# Patient Record
Sex: Female | Born: 1955 | Race: Black or African American | Hispanic: No | Marital: Married | Smoking: Never smoker
Health system: Southern US, Community
[De-identification: ages and names within clinical notes are randomized; demographics above are authoritative.]

## PROBLEM LIST (undated history)

## (undated) DIAGNOSIS — I10 Essential (primary) hypertension: Secondary | ICD-10-CM

## (undated) DIAGNOSIS — M1990 Unspecified osteoarthritis, unspecified site: Secondary | ICD-10-CM

## (undated) DIAGNOSIS — E119 Type 2 diabetes mellitus without complications: Secondary | ICD-10-CM

## (undated) DIAGNOSIS — E785 Hyperlipidemia, unspecified: Secondary | ICD-10-CM

## (undated) DIAGNOSIS — J302 Other seasonal allergic rhinitis: Secondary | ICD-10-CM

## (undated) DIAGNOSIS — E538 Deficiency of other specified B group vitamins: Secondary | ICD-10-CM

## (undated) DIAGNOSIS — H269 Unspecified cataract: Secondary | ICD-10-CM

## (undated) HISTORY — PX: EYE SURGERY: SHX253

## (undated) HISTORY — PX: ABDOMINAL HYSTERECTOMY: SHX81

## (undated) HISTORY — PX: BACK SURGERY: SHX140

---

## 2007-10-22 HISTORY — PX: JOINT REPLACEMENT: SHX530

## 2019-06-24 ENCOUNTER — Encounter (HOSPITAL_COMMUNITY): Payer: Self-pay

## 2019-06-24 NOTE — Patient Instructions (Addendum)
DUE TO COVID-19 ONLY ONE VISITOR IS ALLOWED TO COME WITH YOU AND STAY IN THE WAITING ROOM ONLY DURING PRE OP AND PROCEDURE. THE ONE VISITOR MAY VISIT WITH YOU IN YOUR PRIVATE ROOM DURING VISITING HOURS ONLY!!   COVID SWAB TESTING MUST BE COMPLETED ON: Today immediately after pre op appointment.    [address], [place] -[facility] enter pre surgical testing line (Must self quarantine after testing. Follow instructions on handout.)             Your procedure is scheduled on: Thursday, Sept. 10, 2020   Report to [facility] Main  Entrance   Report to Short Stay at 5:30 AM   Call this number if you have problems the morning of surgery [phone]   ight.     Brush your teeth the morning of surgery.   Do NOT smoke after Midnight   Take these medicines the morning of surgery with A SIP OF WATER Fexofenadine  DO NOT TAKE ANY DIABETIC MEDICATIONS DAY OF YOUR SURGERY                               You may not have any metal on your body including hair pins, jewelry, and body piercings             Do not wear make-up, lotions, powders, perfumes/cologne, or deodorant             Do not wear nail polish.  Do not shave  48 hours prior to surgery.             Do not bring valuables to the hospital. [place].   Contacts, dentures or bridgework may not be worn into surgery.   Bring small overnight bag day of surgery.   Special Instructions:  Clear liquids from midnight until 430 am, drink G 2 drink at 430 am.    CLEAR LIQUID DIET   Foods Allowed                                                                     Foods Excluded  Coffee and tea, regular and decaf                             liquids that you cannot  Plain Jell-O any favor except red or purple                                           see through such as: Fruit ices (not with fruit pulp)                                     milk, soups,  orange juice  Iced Popsicles  All solid food Carbonated beverages, regular and diet                                    Cranberry, grape and apple juices Sports drinks like Gatorade Lightly seasoned clear broth or consume(fat free) Sugar, honey syrup  Sample Menu Breakfast                                Lunch                                     Supper Cranberry juice                    Beef broth                            Chicken broth Jell-O                                     Grape juice                           Apple juice Coffee or tea                        Jell-O                                      Popsicle                                                Coffee or tea                        Coffee or tea  _____________________________________________________________________               Please read over the following fact sheets you were given:  [place] - Preparing for Surgery Before surgery, you can play an important role.  Because skin is not sterile, your skin needs to be as free of germs as possible.  You can reduce the number of germs on your skin by washing with CHG (chlorahexidine gluconate) soap before surgery.  CHG is an antiseptic cleaner which kills germs and bonds with the skin to continue killing germs even after washing. Please DO NOT use if you have an allergy to CHG or antibacterial soaps.  If your skin becomes reddened/irritated stop using the CHG and inform your nurse when you arrive at Short Stay. Do not shave (including legs and underarms) for at least 48 hours prior to the first CHG shower.  You may shave your face/neck.  Please follow these instructions carefully:  1.  Shower with CHG Soap the night before surgery and the  morning of surgery.  2.  If you choose to wash your hair, wash your hair first as usual with your normal  shampoo.  3.  After  you shampoo, rinse your hair and body thoroughly to remove the shampoo.                              4.  Use CHG as you would any other liquid soap.  You can apply chg directly to the skin and wash.  Gently with a scrungie or clean washcloth.  5.  Apply the CHG Soap to your body ONLY FROM THE NECK DOWN.   Do   not use on face/ open                           Wound or open sores. Avoid contact with eyes, ears mouth and   genitals (private parts).                       Wash face,  Genitals (private parts) with your normal soap.             6.  Wash thoroughly, paying special attention to the area where your    surgery  will be performed.  7.  Thoroughly rinse your body with warm water from the neck down.  8.  DO NOT shower/wash with your normal soap after using and rinsing off the CHG Soap.                9.  Pat yourself dry with a clean towel.            10.  Wear clean pajamas.            11.  Place clean sheets on your bed the night of your first shower and do not  sleep with pets. Day of Surgery : Do not apply any lotions/deodorants the morning of surgery.  Please wear clean clothes to the hospital/surgery center.  FAILURE TO FOLLOW THESE INSTRUCTIONS MAY RESULT IN THE CANCELLATION OF YOUR SURGERY  PATIENT SIGNATURE_________________________________  NURSE SIGNATURE__________________________________  ________________________________________________________________________

## 2019-06-29 ENCOUNTER — Ambulatory Visit: Payer: Self-pay | Admitting: Orthopedic Surgery

## 2019-06-29 ENCOUNTER — Other Ambulatory Visit (HOSPITAL_COMMUNITY)
Admission: RE | Admit: 2019-06-29 | Discharge: 2019-06-29 | Disposition: A | Discharge status: Home / Self Care | Payer: BC Managed Care – PPO | Source: Ambulatory Visit | Attending: Orthopedic Surgery | Admitting: Orthopedic Surgery

## 2019-06-29 ENCOUNTER — Encounter (HOSPITAL_COMMUNITY)
Admission: RE | Admit: 2019-06-29 | Discharge: 2019-06-29 | Disposition: A | Discharge status: Home / Self Care | Payer: BC Managed Care – PPO | Source: Ambulatory Visit | Attending: Orthopedic Surgery | Admitting: Orthopedic Surgery

## 2019-06-29 ENCOUNTER — Other Ambulatory Visit: Payer: Self-pay

## 2019-06-29 ENCOUNTER — Other Ambulatory Visit (HOSPITAL_COMMUNITY): Payer: Self-pay | Admitting: *Deleted

## 2019-06-29 ENCOUNTER — Encounter (HOSPITAL_COMMUNITY): Payer: Self-pay

## 2019-06-29 DIAGNOSIS — Z20828 Contact with and (suspected) exposure to other viral communicable diseases: Secondary | ICD-10-CM | POA: Diagnosis not present

## 2019-06-29 DIAGNOSIS — Z01812 Encounter for preprocedural laboratory examination: Secondary | ICD-10-CM | POA: Diagnosis not present

## 2019-06-29 DIAGNOSIS — M1612 Unilateral primary osteoarthritis, left hip: Secondary | ICD-10-CM | POA: Insufficient documentation

## 2019-06-29 HISTORY — DX: Hyperlipidemia, unspecified: E78.5

## 2019-06-29 HISTORY — DX: Other seasonal allergic rhinitis: J30.2

## 2019-06-29 HISTORY — DX: Essential (primary) hypertension: I10

## 2019-06-29 HISTORY — DX: Unspecified cataract: H26.9

## 2019-06-29 HISTORY — DX: Deficiency of other specified B group vitamins: E53.8

## 2019-06-29 HISTORY — DX: Unspecified osteoarthritis, unspecified site: M19.90

## 2019-06-29 HISTORY — DX: Type 2 diabetes mellitus without complications: E11.9

## 2019-06-29 LAB — CBC
HCT: 44 % (ref 36.0–46.0)
Hemoglobin: 14 g/dL (ref 12.0–15.0)
MCH: 28.9 pg (ref 26.0–34.0)
MCHC: 31.8 g/dL (ref 30.0–36.0)
MCV: 90.9 fL (ref 80.0–100.0)
Platelets: 368 10*3/uL (ref 150–400)
RBC: 4.84 MIL/uL (ref 3.87–5.11)
RDW: 14.6 % (ref 11.5–15.5)
WBC: 5.4 10*3/uL (ref 4.0–10.5)
nRBC: 0 % (ref 0.0–0.2)

## 2019-06-29 LAB — GLUCOSE, CAPILLARY: Glucose-Capillary: 130 mg/dL — ABNORMAL HIGH (ref 70–99)

## 2019-06-29 LAB — PROTIME-INR
INR: 1 (ref 0.8–1.2)
Prothrombin Time: 13.4 seconds (ref 11.4–15.2)

## 2019-06-29 LAB — COMPREHENSIVE METABOLIC PANEL
ALT: 21 U/L (ref 0–44)
AST: 20 U/L (ref 15–41)
Albumin: 4.5 g/dL (ref 3.5–5.0)
Alkaline Phosphatase: 54 U/L (ref 38–126)
Anion gap: 10 (ref 5–15)
BUN: 18 mg/dL (ref 8–23)
CO2: 31 mmol/L (ref 22–32)
Calcium: 10.1 mg/dL (ref 8.9–10.3)
Chloride: 101 mmol/L (ref 98–111)
Creatinine, Ser: 0.82 mg/dL (ref 0.44–1.00)
GFR calc Af Amer: >60 mL/min (ref >60)
GFR calc non Af Amer: >60 mL/min (ref >60)
Glucose, Bld: 105 mg/dL — ABNORMAL HIGH (ref 70–99)
Potassium: 4.3 mmol/L (ref 3.5–5.1)
Sodium: 142 mmol/L (ref 135–145)
Total Bilirubin: 0.4 mg/dL (ref 0.3–1.2)
Total Protein: 7.8 g/dL (ref 6.5–8.1)

## 2019-06-29 LAB — URINALYSIS, ROUTINE W REFLEX MICROSCOPIC
Bilirubin Urine: NEGATIVE
Glucose, UA: NEGATIVE mg/dL
Hgb urine dipstick: NEGATIVE
Ketones, ur: NEGATIVE mg/dL
Leukocytes,Ua: NEGATIVE
Nitrite: NEGATIVE
Protein, ur: NEGATIVE mg/dL
Specific Gravity, Urine: 1.018 (ref 1.005–1.030)
pH: 7 (ref 5.0–8.0)

## 2019-06-29 LAB — SURGICAL PCR SCREEN
MRSA, PCR: NEGATIVE
Staphylococcus aureus: NEGATIVE

## 2019-06-29 LAB — ABO/RH: ABO/RH(D): A POS

## 2019-06-29 LAB — SARS CORONAVIRUS 2 (TAT 6-24 HRS): SARS Coronavirus 2: NEGATIVE

## 2019-06-29 NOTE — Progress Notes (Signed)
PCP - dr Audery Amel vasireddy,  [place] patient care lov note 06-15-2019 and medical clearcne note 06-15-2019 on chart phone [phone] Cardiologist - none  Chest x-ray -  EKG - 06-02-2019 dr  Audery Amel vasireddy on chart Stress Test - none ECHO - none Cardiac Cath - none  Sleep Study - none CPAP - none  Fasting Blood Sugar - 110 Checks Blood Sugar  1 x day  hemaglobin a1c 06-02-2019 dr vasireddy on chart ((6.7) Blood Thinner Instructions:none Aspirin Instructions:none Last Dose:  Anesthesia review:   Patient denies shortness of breath, fever, cough and chest pain at PAT appointment   Patient verbalized understanding of instructions that were given to them at the PAT appointment. Patient was also instructed that they will need to review over the PAT instructions again at home before surgery.

## 2019-06-29 NOTE — H&P (View-Only) (Signed)
TOTAL HIP ADMISSION H&P  Patient is admitted for left total hip arthroplasty.  Subjective:  Chief Complaint: left hip pain  HPI: Carly Raymond, 63 y.o. female, has a history of pain and functional disability in the left hip(s) due to arthritis and patient has failed non-surgical conservative treatments for greater than 12 weeks to include NSAID's and/or analgesics, flexibility and strengthening excercises, use of assistive devices, weight reduction as appropriate and activity modification.  Onset of symptoms was gradual starting 4 years ago with rapidlly worsening course since that time.The patient noted no past surgery on the left hip(s).  Patient currently rates pain in the left hip at 10 out of 10 with activity. Patient has night pain, worsening of pain with activity and weight bearing, trendelenberg gait, pain that interfers with activities of daily living, pain with passive range of motion and crepitus. Patient has evidence of subchondral cysts, subchondral sclerosis, periarticular osteophytes and joint space narrowing by imaging studies. This condition presents safety issues increasing the risk of falls. There is no current active infection.  There are no active problems to display for this patient.  Past Medical History:  Diagnosis Date  . Arthritis   . Cataract    left  . Diabetes mellitus without complication ([place])    type 2  . Hyperlipidemia   . Hypertension   . Seasonal allergies   . Vitamin B 12 deficiency     Past Surgical History:  Procedure Laterality Date  . ABDOMINAL HYSTERECTOMY     partail  . BACK SURGERY     lower  . EYE SURGERY Right    cataract  . JOINT REPLACEMENT  2009   right hip replacement    No current facility-administered medications for this visit.    No current outpatient medications on file.   Facility-Administered Medications Ordered in Other Visits  Medication Dose Route Frequency Provider Last Rate Last Dose  . 0.9 %  sodium chloride  infusion   Intravenous Continuous Johntay Doolen, Aaron Edelman, MD      . acetaminophen (OFIRMEV) IV 1,000 mg  1,000 mg Intravenous To OR Palestine Mosco, Aaron Edelman, MD      . bupivacaine-EPINEPHrine (MARCAINE W/ EPI) 0.5% -1:200000 (with pres) injection    PRN Rod Can, MD   30 mL at 07/01/19 0723  . ceFAZolin (ANCEF) IVPB 2g/100 mL premix  2 g Intravenous On Call to Alvarado, Aaron Edelman, MD      . chlorhexidine (HIBICLENS) 4 % liquid 4 application  60 mL Topical Once Saban Heinlen, Aaron Edelman, MD      . chlorhexidine (HIBICLENS) 4 % liquid 4 application  60 mL Topical Once Rod Can, MD      . isopropyl alcohol 70 % external solution    PRN Rod Can, MD   1 application at 40/98/11 [phone]  . ketorolac (TORADOL) 30 MG/ML injection    PRN Rod Can, MD   30 mg at 07/01/19 0724  . lactated ringers infusion   Intravenous Continuous Audry Pili, MD 10 mL/hr at 07/01/19 [phone]    . sodium chloride (PF) 0.9 % injection    PRN Rod Can, MD   30 mL at 07/01/19 0724  . sodium chloride irrigation 0.9 %    PRN Rod Can, MD   1,000 mL at 07/01/19 0722  . sodium chloride irrigation 0.9 %    PRN Rod Can, MD   1,000 mL at 07/01/19 0722  . tranexamic acid (CYKLOKAPRON) IVPB 1,000 mg  1,000 mg Intravenous To OR Rod Can, MD      .  water for irrigation, sterile for irrigation SOLN    PRN Phillipe Clemon, MD   1,000 mL at 07/01/19 0724   No Known Allergies  Social History   Tobacco Use  . Smoking status: Never Smoker  . Smokeless tobacco: Never Used  Substance Use Topics  . Alcohol use: Never    Frequency: Never    No family history on file.   Review of Systems  Constitutional: Negative.   HENT: Negative.   Eyes: Negative.   Respiratory: Negative.   Cardiovascular: Negative.   Gastrointestinal: Negative.   Genitourinary: Negative.   Musculoskeletal: Positive for joint pain.  Skin: Negative.   Neurological: Negative.   Endo/Heme/Allergies: Negative.   Psychiatric/Behavioral:  Negative.     Objective:  Physical Exam  Vitals reviewed. Constitutional: She is oriented to person, place, and time. She appears well-developed and well-nourished.  HENT:  Head: Normocephalic and atraumatic.  Eyes: Pupils are equal, round, and reactive to light. EOM are normal.  Neck: Normal range of motion. No thyromegaly present.  Cardiovascular: Normal rate, regular rhythm and intact distal pulses.  Respiratory: Effort normal. No respiratory distress.  GI: Soft. She exhibits no distension.  Genitourinary:    Genitourinary Comments: deferred   Musculoskeletal:     Left hip: She exhibits decreased range of motion, decreased strength and bony tenderness.  Neurological: She is alert and oriented to person, place, and time. She has normal reflexes.  Skin: Skin is warm and dry.  Psychiatric: She has a normal mood and affect. Her behavior is normal. Judgment and thought content normal.    Vital signs in last 24 hours: @VSRANGES@  Labs:   Estimated body mass index is 29.67 kg/m as calculated from the following:   Height as of 07/01/19: 5' 1" (1.549 m).   Weight as of 07/01/19: 71.2 kg.   Imaging Review Plain radiographs demonstrate severe degenerative joint disease of the left hip(s). The bone quality appears to be adequate for age and reported activity level.      Assessment/Plan:  End stage arthritis, left hip(s)  The patient history, physical examination, clinical judgement of the provider and imaging studies are consistent with end stage degenerative joint disease of the left hip(s) and total hip arthroplasty is deemed medically necessary. The treatment options including medical management, injection therapy, arthroscopy and arthroplasty were discussed at length. The risks and benefits of total hip arthroplasty were presented and reviewed. The risks due to aseptic loosening, infection, stiffness, dislocation/subluxation,  thromboembolic complications and other  imponderables were discussed.  The patient acknowledged the explanation, agreed to proceed with the plan and consent was signed. Patient is being admitted for inpatient treatment for surgery, pain control, PT, OT, prophylactic antibiotics, VTE prophylaxis, progressive ambulation and ADL's and discharge planning.The patient is planning to be discharged home with HEP    Patient's anticipated LOS is less than 2 midnights, meeting these requirements: - Younger than 65 - Lives within 1 hour of care - Has a competent adult at home to recover with post-op recover - NO history of  - Chronic pain requiring opiods  - Diabetes  - Coronary Artery Disease  - Heart failure  - Heart attack  - Stroke  - DVT/VTE  - Cardiac arrhythmia  - Respiratory Failure/COPD  - Renal failure  - Anemia  - Advanced Liver disease     

## 2019-06-29 NOTE — H&P (Signed)
TOTAL HIP ADMISSION H&P  Patient is admitted for left total hip arthroplasty.  Subjective:  Chief Complaint: left hip pain  HPI: Carly Raymond, 63 y.o. female, has a history of pain and functional disability in the left hip(s) due to arthritis and patient has failed non-surgical conservative treatments for greater than 12 weeks to include NSAID's and/or analgesics, flexibility and strengthening excercises, use of assistive devices, weight reduction as appropriate and activity modification.  Onset of symptoms was gradual starting 4 years ago with rapidlly worsening course since that time.The patient noted no past surgery on the left hip(s).  Patient currently rates pain in the left hip at 10 out of 10 with activity. Patient has night pain, worsening of pain with activity and weight bearing, trendelenberg gait, pain that interfers with activities of daily living, pain with passive range of motion and crepitus. Patient has evidence of subchondral cysts, subchondral sclerosis, periarticular osteophytes and joint space narrowing by imaging studies. This condition presents safety issues increasing the risk of falls. There is no current active infection.  There are no active problems to display for this patient.  Past Medical History:  Diagnosis Date  . Arthritis   . Cataract    left  . Diabetes mellitus without complication ([place])    type 2  . Hyperlipidemia   . Hypertension   . Seasonal allergies   . Vitamin B 12 deficiency     Past Surgical History:  Procedure Laterality Date  . ABDOMINAL HYSTERECTOMY     partail  . BACK SURGERY     lower  . EYE SURGERY Right    cataract  . JOINT REPLACEMENT  2009   right hip replacement    No current facility-administered medications for this visit.    No current outpatient medications on file.   Facility-Administered Medications Ordered in Other Visits  Medication Dose Route Frequency Provider Last Rate Last Dose  . 0.9 %  sodium chloride  infusion   Intravenous Continuous Gabriele Zwilling, Aaron Edelman, MD      . acetaminophen (OFIRMEV) IV 1,000 mg  1,000 mg Intravenous To OR Terriyah Westra, Aaron Edelman, MD      . bupivacaine-EPINEPHrine (MARCAINE W/ EPI) 0.5% -1:200000 (with pres) injection    PRN Rod Can, MD   30 mL at 07/01/19 0723  . ceFAZolin (ANCEF) IVPB 2g/100 mL premix  2 g Intravenous On Call to Alvarado, Aaron Edelman, MD      . chlorhexidine (HIBICLENS) 4 % liquid 4 application  60 mL Topical Once Oberia Beaudoin, Aaron Edelman, MD      . chlorhexidine (HIBICLENS) 4 % liquid 4 application  60 mL Topical Once Rod Can, MD      . isopropyl alcohol 70 % external solution    PRN Rod Can, MD   1 application at 40/98/11 [phone]  . ketorolac (TORADOL) 30 MG/ML injection    PRN Rod Can, MD   30 mg at 07/01/19 0724  . lactated ringers infusion   Intravenous Continuous Audry Pili, MD 10 mL/hr at 07/01/19 [phone]    . sodium chloride (PF) 0.9 % injection    PRN Rod Can, MD   30 mL at 07/01/19 0724  . sodium chloride irrigation 0.9 %    PRN Rod Can, MD   1,000 mL at 07/01/19 0722  . sodium chloride irrigation 0.9 %    PRN Rod Can, MD   1,000 mL at 07/01/19 0722  . tranexamic acid (CYKLOKAPRON) IVPB 1,000 mg  1,000 mg Intravenous To OR Rod Can, MD      .  water for irrigation, sterile for irrigation SOLN    PRN Samson FredericSwinteck, Emilya Justen, MD   1,000 mL at 07/01/19 0724   No Known Allergies  Social History   Tobacco Use  . Smoking status: Never Smoker  . Smokeless tobacco: Never Used  Substance Use Topics  . Alcohol use: Never    Frequency: Never    No family history on file.   Review of Systems  Constitutional: Negative.   HENT: Negative.   Eyes: Negative.   Respiratory: Negative.   Cardiovascular: Negative.   Gastrointestinal: Negative.   Genitourinary: Negative.   Musculoskeletal: Positive for joint pain.  Skin: Negative.   Neurological: Negative.   Endo/Heme/Allergies: Negative.   Psychiatric/Behavioral:  Negative.     Objective:  Physical Exam  Vitals reviewed. Constitutional: She is oriented to person, place, and time. She appears well-developed and well-nourished.  HENT:  Head: Normocephalic and atraumatic.  Eyes: Pupils are equal, round, and reactive to light. EOM are normal.  Neck: Normal range of motion. No thyromegaly present.  Cardiovascular: Normal rate, regular rhythm and intact distal pulses.  Respiratory: Effort normal. No respiratory distress.  GI: Soft. She exhibits no distension.  Genitourinary:    Genitourinary Comments: deferred   Musculoskeletal:     Left hip: She exhibits decreased range of motion, decreased strength and bony tenderness.  Neurological: She is alert and oriented to person, place, and time. She has normal reflexes.  Skin: Skin is warm and dry.  Psychiatric: She has a normal mood and affect. Her behavior is normal. Judgment and thought content normal.    Vital signs in last 24 hours: @VSRANGES @  Labs:   Estimated body mass index is 29.67 kg/m as calculated from the following:   Height as of 07/01/19: 5\' 1"  (1.549 m).   Weight as of 07/01/19: 71.2 kg.   Imaging Review Plain radiographs demonstrate severe degenerative joint disease of the left hip(s). The bone quality appears to be adequate for age and reported activity level.      Assessment/Plan:  End stage arthritis, left hip(s)  The patient history, physical examination, clinical judgement of the provider and imaging studies are consistent with end stage degenerative joint disease of the left hip(s) and total hip arthroplasty is deemed medically necessary. The treatment options including medical management, injection therapy, arthroscopy and arthroplasty were discussed at length. The risks and benefits of total hip arthroplasty were presented and reviewed. The risks due to aseptic loosening, infection, stiffness, dislocation/subluxation,  thromboembolic complications and other  imponderables were discussed.  The patient acknowledged the explanation, agreed to proceed with the plan and consent was signed. Patient is being admitted for inpatient treatment for surgery, pain control, PT, OT, prophylactic antibiotics, VTE prophylaxis, progressive ambulation and ADL's and discharge planning.The patient is planning to be discharged home with HEP    Patient's anticipated LOS is less than 2 midnights, meeting these requirements: - Younger than 9565 - Lives within 1 hour of care - Has a competent adult at home to recover with post-op recover - NO history of  - Chronic pain requiring opiods  - Diabetes  - Coronary Artery Disease  - Heart failure  - Heart attack  - Stroke  - DVT/VTE  - Cardiac arrhythmia  - Respiratory Failure/COPD  - Renal failure  - Anemia  - Advanced Liver disease

## 2019-07-01 ENCOUNTER — Inpatient Hospital Stay (HOSPITAL_COMMUNITY): Payer: BC Managed Care – PPO | Admitting: Physician Assistant

## 2019-07-01 ENCOUNTER — Other Ambulatory Visit: Payer: Self-pay

## 2019-07-01 ENCOUNTER — Inpatient Hospital Stay (HOSPITAL_COMMUNITY): Payer: BC Managed Care – PPO

## 2019-07-01 ENCOUNTER — Encounter (HOSPITAL_COMMUNITY)
Admission: RE | Disposition: A | Discharge status: Home / Self Care | Payer: Self-pay | Source: Home / Self Care | Attending: Orthopedic Surgery

## 2019-07-01 ENCOUNTER — Inpatient Hospital Stay (HOSPITAL_COMMUNITY): Payer: BC Managed Care – PPO | Admitting: Registered Nurse

## 2019-07-01 ENCOUNTER — Encounter (HOSPITAL_COMMUNITY): Payer: Self-pay

## 2019-07-01 ENCOUNTER — Inpatient Hospital Stay (HOSPITAL_COMMUNITY)
Admission: RE | Admit: 2019-07-01 | Discharge: 2019-07-02 | DRG: 470 | Disposition: A | Discharge status: Home / Self Care | Payer: BC Managed Care – PPO | Attending: Orthopedic Surgery | Admitting: Orthopedic Surgery

## 2019-07-01 DIAGNOSIS — E785 Hyperlipidemia, unspecified: Secondary | ICD-10-CM | POA: Diagnosis present

## 2019-07-01 DIAGNOSIS — E119 Type 2 diabetes mellitus without complications: Secondary | ICD-10-CM | POA: Diagnosis present

## 2019-07-01 DIAGNOSIS — J302 Other seasonal allergic rhinitis: Secondary | ICD-10-CM | POA: Diagnosis present

## 2019-07-01 DIAGNOSIS — I10 Essential (primary) hypertension: Secondary | ICD-10-CM | POA: Diagnosis present

## 2019-07-01 DIAGNOSIS — M1612 Unilateral primary osteoarthritis, left hip: Secondary | ICD-10-CM | POA: Diagnosis present

## 2019-07-01 DIAGNOSIS — Z09 Encounter for follow-up examination after completed treatment for conditions other than malignant neoplasm: Secondary | ICD-10-CM

## 2019-07-01 DIAGNOSIS — E538 Deficiency of other specified B group vitamins: Secondary | ICD-10-CM | POA: Diagnosis present

## 2019-07-01 DIAGNOSIS — Z96641 Presence of right artificial hip joint: Secondary | ICD-10-CM | POA: Diagnosis present

## 2019-07-01 DIAGNOSIS — Z90710 Acquired absence of both cervix and uterus: Secondary | ICD-10-CM

## 2019-07-01 DIAGNOSIS — Z419 Encounter for procedure for purposes other than remedying health state, unspecified: Secondary | ICD-10-CM

## 2019-07-01 HISTORY — PX: TOTAL HIP ARTHROPLASTY: SHX124

## 2019-07-01 LAB — GLUCOSE, CAPILLARY
Glucose-Capillary: 112 mg/dL — ABNORMAL HIGH (ref 70–99)
Glucose-Capillary: 113 mg/dL — ABNORMAL HIGH (ref 70–99)
Glucose-Capillary: 156 mg/dL — ABNORMAL HIGH (ref 70–99)

## 2019-07-01 LAB — TYPE AND SCREEN
ABO/RH(D): A POS
Antibody Screen: NEGATIVE

## 2019-07-01 SURGERY — ARTHROPLASTY, HIP, TOTAL, ANTERIOR APPROACH
Anesthesia: Monitor Anesthesia Care | Site: Hip | Laterality: Left

## 2019-07-01 MED ORDER — METHOCARBAMOL 500 MG PO TABS: 500.0000 mg | ORAL_TABLET | Freq: Four times a day (QID) | ORAL | Status: DC | PRN

## 2019-07-01 MED ORDER — AMLODIPINE BESYLATE 10 MG PO TABS
10.0000 mg | ORAL_TABLET | Freq: Every day | ORAL | Status: DC
  Administered 2019-07-01: 10 mg via ORAL
  Filled 2019-07-01: qty 1

## 2019-07-01 MED ORDER — DEXAMETHASONE SODIUM PHOSPHATE 10 MG/ML IJ SOLN
INTRAMUSCULAR | Status: AC
  Filled 2019-07-01: qty 1

## 2019-07-01 MED ORDER — ACETAMINOPHEN 10 MG/ML IV SOLN: 1000.0000 mg | Freq: Once | INTRAVENOUS | Status: DC | PRN

## 2019-07-01 MED ORDER — PHENOL 1.4 % MT LIQD
1.0000 | OROMUCOSAL | Status: DC | PRN
  Filled 2019-07-01: qty 177

## 2019-07-01 MED ORDER — BUPIVACAINE IN DEXTROSE 0.75-8.25 % IT SOLN
INTRATHECAL | Status: DC | PRN
  Administered 2019-07-01: 1.6 mL via INTRATHECAL

## 2019-07-01 MED ORDER — PROPOFOL 500 MG/50ML IV EMUL
INTRAVENOUS | Status: DC | PRN
  Administered 2019-07-01: 50 ug/kg/min via INTRAVENOUS

## 2019-07-01 MED ORDER — CHLORHEXIDINE GLUCONATE 4 % EX LIQD: 60.0000 mL | Freq: Once | CUTANEOUS | Status: DC

## 2019-07-01 MED ORDER — ACETAMINOPHEN 160 MG/5ML PO SOLN: 1000.0000 mg | Freq: Once | ORAL | Status: DC | PRN

## 2019-07-01 MED ORDER — CEFAZOLIN SODIUM-DEXTROSE 2-4 GM/100ML-% IV SOLN
2.0000 g | INTRAVENOUS | Status: AC
  Administered 2019-07-01: 2 g via INTRAVENOUS
  Filled 2019-07-01: qty 100

## 2019-07-01 MED ORDER — SODIUM CHLORIDE (PF) 0.9 % IJ SOLN
INTRAMUSCULAR | Status: DC | PRN
  Administered 2019-07-01: 30 mL

## 2019-07-01 MED ORDER — OXYCODONE HCL 5 MG/5ML PO SOLN: 5.0000 mg | Freq: Once | ORAL | Status: DC | PRN

## 2019-07-01 MED ORDER — SODIUM CHLORIDE 0.9 % IR SOLN
Status: DC | PRN
  Administered 2019-07-01: 1000 mL

## 2019-07-01 MED ORDER — SODIUM CHLORIDE 0.9 % IV SOLN
INTRAVENOUS | Status: DC
  Administered 2019-07-01 – 2019-07-02 (×2): via INTRAVENOUS

## 2019-07-01 MED ORDER — FENTANYL CITRATE (PF) 100 MCG/2ML IJ SOLN
25.0000 ug | INTRAMUSCULAR | Status: DC | PRN
  Administered 2019-07-01 (×2): 50 ug via INTRAVENOUS

## 2019-07-01 MED ORDER — PROPOFOL 10 MG/ML IV BOLUS
INTRAVENOUS | Status: AC
  Filled 2019-07-01: qty 80

## 2019-07-01 MED ORDER — MIDAZOLAM HCL 5 MG/5ML IJ SOLN
INTRAMUSCULAR | Status: DC | PRN
  Administered 2019-07-01 (×2): 1 mg via INTRAVENOUS

## 2019-07-01 MED ORDER — WATER FOR IRRIGATION, STERILE IR SOLN
Status: DC | PRN
  Administered 2019-07-01 (×2): 1000 mL

## 2019-07-01 MED ORDER — METOCLOPRAMIDE HCL 5 MG/ML IJ SOLN: 5.0000 mg | Freq: Three times a day (TID) | INTRAMUSCULAR | Status: DC | PRN

## 2019-07-01 MED ORDER — ISOPROPYL ALCOHOL 70 % SOLN
Status: AC
  Filled 2019-07-01: qty 480

## 2019-07-01 MED ORDER — MENTHOL 3 MG MT LOZG: 1.0000 | LOZENGE | OROMUCOSAL | Status: DC | PRN

## 2019-07-01 MED ORDER — FENTANYL CITRATE (PF) 100 MCG/2ML IJ SOLN
INTRAMUSCULAR | Status: DC | PRN
  Administered 2019-07-01 (×2): 50 ug via INTRAVENOUS

## 2019-07-01 MED ORDER — FENTANYL CITRATE (PF) 100 MCG/2ML IJ SOLN
INTRAMUSCULAR | Status: AC
  Filled 2019-07-01: qty 2

## 2019-07-01 MED ORDER — POVIDONE-IODINE 10 % EX SWAB
2.0000 "application " | Freq: Once | CUTANEOUS | Status: AC
  Administered 2019-07-01: 2 via TOPICAL

## 2019-07-01 MED ORDER — ISOPROPYL ALCOHOL 70 % SOLN
Status: DC | PRN
  Administered 2019-07-01: 1 via TOPICAL

## 2019-07-01 MED ORDER — POLYETHYLENE GLYCOL 3350 17 G PO PACK: 17.0000 g | PACK | Freq: Every day | ORAL | Status: DC | PRN

## 2019-07-01 MED ORDER — METHOCARBAMOL 500 MG IVPB - SIMPLE MED
INTRAVENOUS | Status: AC
  Filled 2019-07-01: qty 50

## 2019-07-01 MED ORDER — ACETAMINOPHEN 10 MG/ML IV SOLN
1000.0000 mg | INTRAVENOUS | Status: AC
  Administered 2019-07-01: 08:00:00 1000 mg via INTRAVENOUS
  Filled 2019-07-01: qty 100

## 2019-07-01 MED ORDER — METFORMIN HCL 500 MG PO TABS
1000.0000 mg | ORAL_TABLET | Freq: Two times a day (BID) | ORAL | Status: DC
  Administered 2019-07-01 – 2019-07-02 (×2): 1000 mg via ORAL
  Filled 2019-07-01 (×2): qty 2

## 2019-07-01 MED ORDER — CEFAZOLIN SODIUM-DEXTROSE 2-4 GM/100ML-% IV SOLN
2.0000 g | Freq: Four times a day (QID) | INTRAVENOUS | Status: AC
  Administered 2019-07-01 (×2): 2 g via INTRAVENOUS
  Filled 2019-07-01 (×2): qty 100

## 2019-07-01 MED ORDER — EPHEDRINE SULFATE-NACL 50-0.9 MG/10ML-% IV SOSY
PREFILLED_SYRINGE | INTRAVENOUS | Status: DC | PRN
  Administered 2019-07-01: 10 mg via INTRAVENOUS

## 2019-07-01 MED ORDER — LACTATED RINGERS IV SOLN
INTRAVENOUS | Status: DC
  Administered 2019-07-01 (×2): via INTRAVENOUS

## 2019-07-01 MED ORDER — ASPIRIN 81 MG PO CHEW
81.0000 mg | CHEWABLE_TABLET | Freq: Two times a day (BID) | ORAL | Status: DC
  Administered 2019-07-01 – 2019-07-02 (×2): 81 mg via ORAL
  Filled 2019-07-01 (×2): qty 1

## 2019-07-01 MED ORDER — SPIRONOLACTONE 25 MG PO TABS
50.0000 mg | ORAL_TABLET | Freq: Every day | ORAL | Status: DC
  Administered 2019-07-01 – 2019-07-02 (×2): 50 mg via ORAL
  Filled 2019-07-01 (×2): qty 2

## 2019-07-01 MED ORDER — POVIDONE-IODINE 10 % EX SWAB: 2.0000 "application " | Freq: Once | CUTANEOUS | Status: DC

## 2019-07-01 MED ORDER — KETOROLAC TROMETHAMINE 30 MG/ML IJ SOLN
INTRAMUSCULAR | Status: AC
  Filled 2019-07-01: qty 1

## 2019-07-01 MED ORDER — ONDANSETRON HCL 4 MG/2ML IJ SOLN: 4.0000 mg | Freq: Four times a day (QID) | INTRAMUSCULAR | Status: DC | PRN

## 2019-07-01 MED ORDER — HYDROCODONE-ACETAMINOPHEN 7.5-325 MG PO TABS: 1.0000 | ORAL_TABLET | ORAL | Status: DC | PRN

## 2019-07-01 MED ORDER — ONDANSETRON HCL 4 MG/2ML IJ SOLN
INTRAMUSCULAR | Status: AC
  Filled 2019-07-01: qty 2

## 2019-07-01 MED ORDER — BUPIVACAINE-EPINEPHRINE (PF) 0.5% -1:200000 IJ SOLN
INTRAMUSCULAR | Status: AC
  Filled 2019-07-01: qty 30

## 2019-07-01 MED ORDER — LIDOCAINE 2% (20 MG/ML) 5 ML SYRINGE
INTRAMUSCULAR | Status: AC
  Filled 2019-07-01: qty 5

## 2019-07-01 MED ORDER — ONDANSETRON HCL 4 MG PO TABS: 4.0000 mg | ORAL_TABLET | Freq: Four times a day (QID) | ORAL | Status: DC | PRN

## 2019-07-01 MED ORDER — SODIUM CHLORIDE 0.9 % IV SOLN
INTRAVENOUS | Status: DC
  Administered 2019-07-01: 12:00:00 via INTRAVENOUS

## 2019-07-01 MED ORDER — METHOCARBAMOL 500 MG IVPB - SIMPLE MED
500.0000 mg | Freq: Four times a day (QID) | INTRAVENOUS | Status: DC | PRN
  Administered 2019-07-01: 500 mg via INTRAVENOUS
  Filled 2019-07-01: qty 50

## 2019-07-01 MED ORDER — DOCUSATE SODIUM 100 MG PO CAPS
100.0000 mg | ORAL_CAPSULE | Freq: Two times a day (BID) | ORAL | Status: DC
  Administered 2019-07-01 – 2019-07-02 (×2): 100 mg via ORAL
  Filled 2019-07-01 (×2): qty 1

## 2019-07-01 MED ORDER — SODIUM CHLORIDE (PF) 0.9 % IJ SOLN
INTRAMUSCULAR | Status: AC
  Filled 2019-07-01: qty 50

## 2019-07-01 MED ORDER — LOSARTAN POTASSIUM 50 MG PO TABS
100.0000 mg | ORAL_TABLET | Freq: Every day | ORAL | Status: DC
  Administered 2019-07-01 – 2019-07-02 (×2): 100 mg via ORAL
  Filled 2019-07-01 (×2): qty 2

## 2019-07-01 MED ORDER — SENNA 8.6 MG PO TABS
1.0000 | ORAL_TABLET | Freq: Two times a day (BID) | ORAL | Status: DC
  Administered 2019-07-02 (×2): 8.6 mg via ORAL
  Filled 2019-07-01 (×2): qty 1

## 2019-07-01 MED ORDER — DEXAMETHASONE SODIUM PHOSPHATE 10 MG/ML IJ SOLN
INTRAMUSCULAR | Status: DC | PRN
  Administered 2019-07-01: 5 mg via INTRAVENOUS

## 2019-07-01 MED ORDER — ATORVASTATIN CALCIUM 40 MG PO TABS
80.0000 mg | ORAL_TABLET | Freq: Every day | ORAL | Status: DC
  Administered 2019-07-01: 80 mg via ORAL
  Filled 2019-07-01: qty 2

## 2019-07-01 MED ORDER — EPHEDRINE 5 MG/ML INJ
INTRAVENOUS | Status: AC
  Filled 2019-07-01: qty 10

## 2019-07-01 MED ORDER — TRANEXAMIC ACID-NACL 1000-0.7 MG/100ML-% IV SOLN
1000.0000 mg | INTRAVENOUS | Status: AC
  Administered 2019-07-01: 1000 mg via INTRAVENOUS
  Filled 2019-07-01: qty 100

## 2019-07-01 MED ORDER — INSULIN ASPART 100 UNIT/ML ~~LOC~~ SOLN
0.0000 [IU] | Freq: Three times a day (TID) | SUBCUTANEOUS | Status: DC
  Administered 2019-07-02: 09:00:00 1 [IU] via SUBCUTANEOUS

## 2019-07-01 MED ORDER — DIPHENHYDRAMINE HCL 12.5 MG/5ML PO ELIX: 12.5000 mg | ORAL_SOLUTION | ORAL | Status: DC | PRN

## 2019-07-01 MED ORDER — HYDROCODONE-ACETAMINOPHEN 5-325 MG PO TABS
1.0000 | ORAL_TABLET | ORAL | Status: DC | PRN
  Administered 2019-07-01 – 2019-07-02 (×4): 2 via ORAL
  Filled 2019-07-01 (×5): qty 2

## 2019-07-01 MED ORDER — BUPIVACAINE-EPINEPHRINE 0.5% -1:200000 IJ SOLN
INTRAMUSCULAR | Status: DC | PRN
  Administered 2019-07-01: 30 mL

## 2019-07-01 MED ORDER — ACETAMINOPHEN 500 MG PO TABS: 1000.0000 mg | ORAL_TABLET | Freq: Once | ORAL | Status: DC | PRN

## 2019-07-01 MED ORDER — ONDANSETRON HCL 4 MG/2ML IJ SOLN
INTRAMUSCULAR | Status: DC | PRN
  Administered 2019-07-01: 4 mg via INTRAVENOUS

## 2019-07-01 MED ORDER — KETOROLAC TROMETHAMINE 15 MG/ML IJ SOLN
15.0000 mg | Freq: Four times a day (QID) | INTRAMUSCULAR | Status: AC
  Administered 2019-07-01 – 2019-07-02 (×4): 15 mg via INTRAVENOUS
  Filled 2019-07-01 (×4): qty 1

## 2019-07-01 MED ORDER — ACETAMINOPHEN 325 MG PO TABS: 325.0000 mg | ORAL_TABLET | Freq: Four times a day (QID) | ORAL | Status: DC | PRN

## 2019-07-01 MED ORDER — DEXAMETHASONE SODIUM PHOSPHATE 10 MG/ML IJ SOLN
10.0000 mg | Freq: Once | INTRAMUSCULAR | Status: AC
  Administered 2019-07-02: 10 mg via INTRAVENOUS
  Filled 2019-07-01: qty 1

## 2019-07-01 MED ORDER — MORPHINE SULFATE (PF) 2 MG/ML IV SOLN: 0.5000 mg | INTRAVENOUS | Status: DC | PRN

## 2019-07-01 MED ORDER — LIDOCAINE 2% (20 MG/ML) 5 ML SYRINGE
INTRAMUSCULAR | Status: DC | PRN
  Administered 2019-07-01: 40 mg via INTRAVENOUS

## 2019-07-01 MED ORDER — ALUM & MAG HYDROXIDE-SIMETH 200-200-20 MG/5ML PO SUSP: 30.0000 mL | ORAL | Status: DC | PRN

## 2019-07-01 MED ORDER — LORATADINE 10 MG PO TABS
10.0000 mg | ORAL_TABLET | Freq: Every day | ORAL | Status: DC
  Administered 2019-07-01 – 2019-07-02 (×2): 10 mg via ORAL
  Filled 2019-07-01 (×2): qty 1

## 2019-07-01 MED ORDER — OXYCODONE HCL 5 MG PO TABS: 5.0000 mg | ORAL_TABLET | Freq: Once | ORAL | Status: DC | PRN

## 2019-07-01 MED ORDER — KETOROLAC TROMETHAMINE 30 MG/ML IJ SOLN
INTRAMUSCULAR | Status: DC | PRN
  Administered 2019-07-01: 30 mg

## 2019-07-01 MED ORDER — INSULIN ASPART 100 UNIT/ML ~~LOC~~ SOLN: 0.0000 [IU] | Freq: Every day | SUBCUTANEOUS | Status: DC

## 2019-07-01 MED ORDER — INFLUENZA VAC SPLIT QUAD 0.5 ML IM SUSY: 0.5000 mL | PREFILLED_SYRINGE | INTRAMUSCULAR | Status: DC

## 2019-07-01 MED ORDER — PHENYLEPHRINE 40 MCG/ML (10ML) SYRINGE FOR IV PUSH (FOR BLOOD PRESSURE SUPPORT)
PREFILLED_SYRINGE | INTRAVENOUS | Status: AC
  Filled 2019-07-01: qty 10

## 2019-07-01 MED ORDER — MIDAZOLAM HCL 2 MG/2ML IJ SOLN
INTRAMUSCULAR | Status: AC
  Filled 2019-07-01: qty 2

## 2019-07-01 MED ORDER — PHENYLEPHRINE 40 MCG/ML (10ML) SYRINGE FOR IV PUSH (FOR BLOOD PRESSURE SUPPORT)
PREFILLED_SYRINGE | INTRAVENOUS | Status: DC | PRN
  Administered 2019-07-01: 80 ug via INTRAVENOUS

## 2019-07-01 MED ORDER — METOCLOPRAMIDE HCL 5 MG PO TABS: 5.0000 mg | ORAL_TABLET | Freq: Three times a day (TID) | ORAL | Status: DC | PRN

## 2019-07-01 SURGICAL SUPPLY — 56 items
BAG DECANTER FOR FLEXI CONT (MISCELLANEOUS) IMPLANT
BAG ZIPLOCK 12X15 (MISCELLANEOUS) IMPLANT
BLADE SURG SZ10 CARB STEEL (BLADE) IMPLANT
CHLORAPREP W/TINT 26 (MISCELLANEOUS) ×3 IMPLANT
COVER PERINEAL POST (MISCELLANEOUS) ×3 IMPLANT
COVER SURGICAL LIGHT HANDLE (MISCELLANEOUS) ×3 IMPLANT
COVER WAND RF STERILE (DRAPES) IMPLANT
CUP ACET PINNACLE SECTR 48MM (Joint) ×1 IMPLANT
DECANTER SPIKE VIAL GLASS SM (MISCELLANEOUS) ×3 IMPLANT
DERMABOND ADVANCED (GAUZE/BANDAGES/DRESSINGS) ×4
DERMABOND ADVANCED .7 DNX12 (GAUZE/BANDAGES/DRESSINGS) ×2 IMPLANT
DRAPE IMP U-DRAPE 54X76 (DRAPES) ×3 IMPLANT
DRAPE SHEET LG 3/4 BI-LAMINATE (DRAPES) ×9 IMPLANT
DRAPE STERI IOBAN 125X83 (DRAPES) IMPLANT
DRAPE U-SHAPE 47X51 STRL (DRAPES) ×6 IMPLANT
DRSG AQUACEL AG ADV 3.5X10 (GAUZE/BANDAGES/DRESSINGS) ×3 IMPLANT
ELECT PENCIL ROCKER SW 15FT (MISCELLANEOUS) ×3 IMPLANT
ELECT REM PT RETURN 15FT ADLT (MISCELLANEOUS) ×3 IMPLANT
GAUZE SPONGE 4X4 12PLY STRL (GAUZE/BANDAGES/DRESSINGS) ×3 IMPLANT
GLOVE BIO SURGEON STRL SZ8.5 (GLOVE) ×6 IMPLANT
GLOVE BIOGEL PI IND STRL 8.5 (GLOVE) ×1 IMPLANT
GLOVE BIOGEL PI INDICATOR 8.5 (GLOVE) ×2
GOWN SPEC L3 XXLG W/TWL (GOWN DISPOSABLE) ×3 IMPLANT
HANDPIECE INTERPULSE COAX TIP (DISPOSABLE) ×2
HEAD FEMORAL 32 CERAMIC (Hips) ×3 IMPLANT
HOLDER FOLEY CATH W/STRAP (MISCELLANEOUS) ×3 IMPLANT
HOOD PEEL AWAY FLYTE STAYCOOL (MISCELLANEOUS) ×12 IMPLANT
JET LAVAGE IRRISEPT WOUND (IRRIGATION / IRRIGATOR) ×3
KIT TURNOVER KIT A (KITS) IMPLANT
LAVAGE JET IRRISEPT WOUND (IRRIGATION / IRRIGATOR) ×1 IMPLANT
LINER ACET 32X48 (Liner) ×3 IMPLANT
MANIFOLD NEPTUNE II (INSTRUMENTS) ×3 IMPLANT
MARKER SKIN DUAL TIP RULER LAB (MISCELLANEOUS) ×3 IMPLANT
NDL SAFETY ECLIPSE 18X1.5 (NEEDLE) ×1 IMPLANT
NEEDLE HYPO 18GX1.5 SHARP (NEEDLE) ×2
NEEDLE SPNL 18GX3.5 QUINCKE PK (NEEDLE) ×3 IMPLANT
PACK ANTERIOR HIP CUSTOM (KITS) ×3 IMPLANT
PINNSECTOR W/GRIP ACE CUP 48MM (Joint) ×3 IMPLANT
SAW OSC TIP CART 19.5X105X1.3 (SAW) ×3 IMPLANT
SEALER BIPOLAR AQUA 6.0 (INSTRUMENTS) ×3 IMPLANT
SET HNDPC FAN SPRY TIP SCT (DISPOSABLE) ×1 IMPLANT
STEM TRI LOC BPS GRIPTON SZ 2 ×1 IMPLANT
SUT ETHIBOND NAB CT1 #1 30IN (SUTURE) ×6 IMPLANT
SUT MNCRL AB 3-0 PS2 18 (SUTURE) ×3 IMPLANT
SUT MNCRL AB 4-0 PS2 18 (SUTURE) ×3 IMPLANT
SUT MON AB 2-0 CT1 36 (SUTURE) ×6 IMPLANT
SUT STRATAFIX PDO 1 14 VIOLET (SUTURE) ×2
SUT STRATFX PDO 1 14 VIOLET (SUTURE) ×1
SUT VIC AB 2-0 CT1 27 (SUTURE) ×2
SUT VIC AB 2-0 CT1 TAPERPNT 27 (SUTURE) ×1 IMPLANT
SUTURE STRATFX PDO 1 14 VIOLET (SUTURE) ×1 IMPLANT
SYR 3ML LL SCALE MARK (SYRINGE) ×3 IMPLANT
TRAY FOLEY MTR SLVR 16FR STAT (SET/KITS/TRAYS/PACK) IMPLANT
TRI LOC BPS W GRIPTON SZ 2 ×3 IMPLANT
WATER STERILE IRR 1000ML POUR (IV SOLUTION) ×3 IMPLANT
YANKAUER SUCT BULB TIP 10FT TU (MISCELLANEOUS) ×3 IMPLANT

## 2019-07-01 NOTE — Transfer of Care (Signed)
Immediate Anesthesia Transfer of Care Note  Patient: Carly Raymond  Procedure(s) Performed: TOTAL HIP ARTHROPLASTY ANTERIOR APPROACH (Left Hip)  Patient Location: PACU  Anesthesia Type:Spinal  Level of Consciousness: sedated  Airway & Oxygen Therapy: Patient Spontanous Breathing and Patient connected to face mask oxygen  Post-op Assessment: Report given to RN and Post -op Vital signs reviewed and stable  Post vital signs: Reviewed and stable  Last Vitals:  Vitals Value Taken Time  BP    Temp    Pulse 68 07/01/19 1012  Resp 18 07/01/19 1012  SpO2 99 % 07/01/19 1012  Vitals shown include unvalidated device data.  Last Pain:  Vitals:   07/01/19 0607  TempSrc: Oral  PainSc:          Complications: No apparent anesthesia complications

## 2019-07-01 NOTE — Op Note (Signed)
OPERATIVE REPORT  SURGEON: Rod Can, MD   ASSISTANT: Nehemiah Massed, PA-C  PREOPERATIVE DIAGNOSIS: Left hip arthritis.   POSTOPERATIVE DIAGNOSIS: Left hip arthritis.   PROCEDURE: Left total hip arthroplasty, anterior approach.   IMPLANTS: DePuy Tri Lock stem, size 2, hi offset. DePuy Pinnacle Cup, size 48 mm. DePuy Altrx liner, size 32 by 48 mm, neutral. DePuy Biolox ceramic head ball, size 32 + 1 mm.  ANESTHESIA:  Spinal  ESTIMATED BLOOD LOSS:-300 mL    ANTIBIOTICS: 2 g Ancef.  DRAINS: None.  COMPLICATIONS: None.   CONDITION: PACU - hemodynamically stable.   BRIEF CLINICAL NOTE: Carly Raymond is a 63 y.o. female with a long-standing history of Left hip arthritis. After failing conservative management, the patient was indicated for total hip arthroplasty. The risks, benefits, and alternatives to the procedure were explained, and the patient elected to proceed.  PROCEDURE IN DETAIL: Surgical site was marked by myself in the pre-op holding area. Once inside the operating room, spinal anesthesia was obtained, and a foley catheter was inserted. The patient was then positioned on the [place] table.  All bony prominences were well padded.  The hip was prepped and draped in the normal sterile surgical fashion.  A time-out was called verifying side and site of surgery. The patient received IV antibiotics within 60 minutes of beginning the procedure.   The direct anterior approach to the hip was performed through the Hueter interval.  Lateral femoral circumflex vessels were treated with the Auqumantys. The anterior capsule was exposed and an inverted T capsulotomy was made. The femoral neck cut was made to the level of the templated cut.  A corkscrew was placed into the head and the head was removed.  The femoral head was found to have eburnated bone. The head was passed to the back table and was measured.   Acetabular exposure was achieved, and the pulvinar and labrum were  excised. Sequential reaming of the acetabulum was then performed up to a size 47 mm reamer. A 48 mm cup was then opened and impacted into place at approximately 40 degrees of abduction and 20 degrees of anteversion. The final polyethylene liner was impacted into place and acetabular osteophytes were removed.    I then gained femoral exposure taking care to protect the abductors and greater trochanter.  This was performed using standard external rotation, extension, and adduction.  The capsule was peeled off the inner aspect of the greater trochanter, taking care to preserve the short external rotators. A cookie cutter was used to enter the femoral canal, and then the femoral canal finder was placed.  Sequential broaching was performed up to a size 2.  Calcar planer was used on the femoral neck remnant.  I placed a hi offset neck and a trial head ball.  The hip was reduced.  Leg lengths and offset were checked fluoroscopically.  The hip was dislocated and trial components were removed.  The final implants were placed, and the hip was reduced.  Fluoroscopy was used to confirm component position and leg lengths.  At 90 degrees of external rotation and full extension, the hip was stable to an anterior directed force.   The wound was copiously irrigated with Irrisept solution and normal saline using pule lavage.  Marcaine solution was injected into the periarticular soft tissue.  The wound was closed in layers using #1 Stratafix for the fascia, 2-0 Vicryl for the subcutaneous fat, 2-0 Monocryl for the deep dermal layer, 3-0 running Monocryl subcuticular stitch, and Dermabond  for the skin.  Once the glue was fully dried, an Aquacell Ag dressing was applied.  The patient was transported to the recovery room in stable condition.  Sponge, needle, and instrument counts were correct at the end of the case x2.  The patient tolerated the procedure well and there were no known complications.  Please note that a surgical  assistant was a medical necessity for this procedure to perform it in a safe and expeditious manner. Assistant was necessary to provide appropriate retraction of vital neurovascular structures, to prevent femoral fracture, and to allow for anatomic placement of the prosthesis.

## 2019-07-01 NOTE — Plan of Care (Signed)

## 2019-07-01 NOTE — Evaluation (Signed)
Physical Therapy Evaluation Patient Details Name: Carly BorgBarbara Raymond MRN: [id number] DOB: 09/30/1956 Today's Date: 07/01/2019   History of Present Illness  Pt s/p L THR and hx of R THR (09) and DM  Clinical Impression  Pt s/p L THR and presents with decreased L LE strength/ROM and post op pain limiting functional mobility.  Pt should progress to dc home with family assist.    Follow Up Recommendations Follow surgeon's recommendation for DC plan and follow-up therapies    Equipment Recommendations  None recommended by PT    Recommendations for Other Services       Precautions / Restrictions Precautions Precautions: Fall Restrictions Weight Bearing Restrictions: No Other Position/Activity Restrictions: WBAT      Mobility  Bed Mobility Overal bed mobility: Needs Assistance Bed Mobility: Supine to Sit     Supine to sit: Min guard     General bed mobility comments: cues for sequence and use of R LE to self assist; min guard for safety  Transfers Overall transfer level: Needs assistance Equipment used: Rolling walker (2 wheeled) Transfers: Sit to/from Stand Sit to Stand: Min guard         General transfer comment: cues for LE management and use of UEs to self assist  Ambulation/Gait Ambulation/Gait assistance: Min assist;Min guard Gait Distance (Feet): 100 Feet Assistive device: Rolling walker (2 wheeled) Gait Pattern/deviations: Step-to pattern;Step-through pattern;Decreased step length - right;Decreased step length - left;Shuffle     General Gait Details: min cues for sequence, posture and position from [employer]  Stairs            Wheelchair Mobility    Modified Rankin (Stroke Patients Only)       Balance Overall balance assessment: Mild deficits observed, not formally tested                                           Pertinent Vitals/Pain Pain Assessment: 0-10 Pain Score: 2  Pain Location: L hip Pain Descriptors / Indicators:  Aching;Sore Pain Intervention(s): Limited activity within patient's tolerance;Monitored during session;Premedicated before session;Ice applied    Home Living Family/patient expects to be discharged to:: Private residence Living Arrangements: Spouse/significant other;Children Available Help at Discharge: Family Type of Home: House Home Access: Level entry     Home Layout: Able to live on main level with bedroom/bathroom Home Equipment: Environmental consultantWalker - 2 wheels;Bedside commode;Shower seat      Prior Function Level of Independence: Independent               Hand Dominance        Extremity/Trunk Assessment   Upper Extremity Assessment Upper Extremity Assessment: Overall WFL for tasks assessed    Lower Extremity Assessment Lower Extremity Assessment: LLE deficits/detail    Cervical / Trunk Assessment Cervical / Trunk Assessment: Normal  Communication   Communication: No difficulties  Cognition Arousal/Alertness: Awake/alert Behavior During Therapy: WFL for tasks assessed/performed Overall Cognitive Status: Within Functional Limits for tasks assessed                                        General Comments      Exercises Total Joint Exercises Ankle Circles/Pumps: AROM;Both;15 reps;Supine   Assessment/Plan    PT Assessment Patient needs continued PT services  PT Problem List Decreased strength;Decreased range of motion;Decreased  activity tolerance;Decreased mobility;Decreased knowledge of use of DME;Pain       PT Treatment Interventions DME instruction;Gait training;Functional mobility training;Therapeutic activities;Therapeutic exercise;Patient/family education    PT Goals (Current goals can be found in the Care Plan section)  Acute Rehab PT Goals Patient Stated Goal: Regain IND PT Goal Formulation: With patient Time For Goal Achievement: 07/08/19 Potential to Achieve Goals: Good    Frequency 7X/week   Barriers to discharge         Co-evaluation               AM-PAC PT "6 Clicks" Mobility  Outcome Measure Help needed turning from your back to your side while in a flat bed without using bedrails?: A Little Help needed moving from lying on your back to sitting on the side of a flat bed without using bedrails?: A Little Help needed moving to and from a bed to a chair (including a wheelchair)?: A Little Help needed standing up from a chair using your arms (e.g., wheelchair or bedside chair)?: A Little Help needed to walk in hospital room?: A Little Help needed climbing 3-5 steps with a railing? : A Little 6 Click Score: 18    End of Session Equipment Utilized During Treatment: Gait belt Activity Tolerance: Patient tolerated treatment well Patient left: in chair;with call bell/phone within reach;with chair alarm set Nurse Communication: Mobility status PT Visit Diagnosis: Difficulty in walking, not elsewhere classified (R26.2)    Time: 3716-9678 PT Time Calculation (min) (ACUTE ONLY): 20 min   Charges:   PT Evaluation $PT Eval Low Complexity: Carly Raymond Pager [phone] Office [phone]   Carly Raymond 07/01/2019, 5:02 PM

## 2019-07-01 NOTE — Interval H&P Note (Signed)
History and Physical Interval Note:  07/01/2019 7:29 AM  Carly Raymond  has presented today for surgery, with the diagnosis of Left hip osteoarthritis.  The various methods of treatment have been discussed with the patient and family. After consideration of risks, benefits and other options for treatment, the patient has consented to  Procedure(s): TOTAL HIP ARTHROPLASTY ANTERIOR APPROACH (Left) as a surgical intervention.  The patient's history has been reviewed, patient examined, no change in status, stable for surgery.  I have reviewed the patient's chart and labs.  Questions were answered to the patient's satisfaction.     Hilton Cork Keir Viernes

## 2019-07-01 NOTE — Anesthesia Procedure Notes (Signed)
Spinal  Patient location during procedure: OR Start time: 07/01/2019 7:35 AM End time: 07/01/2019 7:40 AM Staffing Anesthesiologist: Oleta Mouse, MD Resident/CRNA: Talbot Grumbling, CRNA Performed: resident/CRNA  Preanesthetic Checklist Completed: patient identified, surgical consent, pre-op evaluation, timeout performed, IV checked, risks and benefits discussed and monitors and equipment checked Spinal Block Patient position: sitting Prep: DuraPrep Patient monitoring: heart rate, cardiac monitor, continuous pulse ox and blood pressure Approach: midline Location: L3-4 Injection technique: single-shot Needle Needle type: Pencan  Needle gauge: 24 G Needle length: 9 cm Assessment Sensory level: T4 Additional Notes Clear CSF, no paresthesia, patient tolerated well.

## 2019-07-01 NOTE — Anesthesia Preprocedure Evaluation (Signed)
Anesthesia Evaluation  Patient identified by MRN, date of birth, ID band Patient awake    Reviewed: Allergy & Precautions, NPO status , Patient's Chart, lab work & pertinent test results  History of Anesthesia Complications Negative for: history of anesthetic complications  Airway Mallampati: II  TM Distance: >3 FB Neck ROM: Full    Dental  (+) Dental Advisory Given   Pulmonary neg pulmonary ROS, neg shortness of breath, neg sleep apnea, neg COPD, neg recent URI,    breath sounds clear to auscultation       Cardiovascular hypertension, Pt. on medications (-) angina(-) Past MI and (-) CHF  Rhythm:Regular     Neuro/Psych negative neurological ROS  negative psych ROS   GI/Hepatic negative GI ROS, Neg liver ROS,   Endo/Other  diabetes, Oral Hypoglycemic Agents  Renal/GU negative Renal ROS     Musculoskeletal   Abdominal   Peds  Hematology negative hematology ROS (+)   Anesthesia Other Findings HGB14, INR 1, Plt 368   Reproductive/Obstetrics                             Anesthesia Physical Anesthesia Plan  ASA: II  Anesthesia Plan: MAC and Spinal   Post-op Pain Management:    Induction: Intravenous  PONV Risk Score and Plan: 2 and Ondansetron and Propofol infusion  Airway Management Planned: Nasal Cannula  Additional Equipment: None  Intra-op Plan:   Post-operative Plan:   Informed Consent: I have reviewed the patients History and Physical, chart, labs and discussed the procedure including the risks, benefits and alternatives for the proposed anesthesia with the patient or authorized representative who has indicated his/her understanding and acceptance.     Dental advisory given  Plan Discussed with: Surgeon  Anesthesia Plan Comments:         Anesthesia Quick Evaluation

## 2019-07-01 NOTE — Discharge Instructions (Signed)
°Dr. Teofilo Lupinacci °Joint Replacement Specialist °[facility] °[address] °[place], [place] [address] °[phone] ° ° °TOTAL HIP REPLACEMENT POSTOPERATIVE DIRECTIONS ° ° ° °Hip Rehabilitation, Guidelines Following Surgery  ° °WEIGHT BEARING °Weight bearing as tolerated with assist device (walker, cane, etc) as directed, use it as long as suggested by your surgeon or therapist, typically at least 4-6 weeks. ° °The results of a hip operation are greatly improved after range of motion and muscle strengthening exercises. Follow all safety measures which are given to protect your hip. If any of these exercises cause increased pain or swelling in your joint, decrease the amount until you are comfortable again. Then slowly increase the exercises. Call your caregiver if you have problems or questions.  ° °HOME CARE INSTRUCTIONS  °Most of the following instructions are designed to prevent the dislocation of your new hip.  °Remove items at home which could result in a fall. This includes throw rugs or furniture in walking pathways.  °Continue medications as instructed at time of discharge. °· You may have some home medications which will be placed on hold until you complete the course of blood thinner medication. °· You may start showering once you are discharged home. Do not remove your dressing. °Do not put on socks or shoes without following the instructions of your caregivers.   °Sit on chairs with arms. Use the chair arms to help push yourself up when arising.  °Arrange for the use of a toilet seat elevator so you are not sitting low.  °· Walk with walker as instructed.  °You may resume a sexual relationship in one month or when given the OK by your caregiver.  °Use walker as long as suggested by your caregivers.  °You may put full weight on your legs and walk as much as is comfortable. °Avoid periods of inactivity such as sitting longer than an hour when not asleep. This helps prevent  blood clots.  °You may return to work once you are cleared by your surgeon.  °Do not drive a car for 6 weeks or until released by your surgeon.  °Do not drive while taking narcotics.  °Wear elastic stockings for two weeks following surgery during the day but you may remove then at night.  °Make sure you keep all of your appointments after your operation with all of your doctors and caregivers. You should call the office at the above phone number and make an appointment for approximately two weeks after the date of your surgery. °Please pick up a stool softener and laxative for home use as long as you are requiring pain medications. °· ICE to the affected hip every three hours for 30 minutes at a time and then as needed for pain and swelling. Continue to use ice on the hip for pain and swelling from surgery. You may notice swelling that will progress down to the foot and ankle.  This is normal after surgery.  Elevate the leg when you are not up walking on it.   °It is important for you to complete the blood thinner medication as prescribed by your doctor. °· Continue to use the breathing machine which will help keep your temperature down.  It is common for your temperature to cycle up and down following surgery, especially at night when you are not up moving around and exerting yourself.  The breathing machine keeps your lungs expanded and your temperature down. ° °RANGE OF MOTION AND STRENGTHENING EXERCISES  °These exercises are   designed to help you keep full movement of your hip joint. Follow your caregiver's or physical therapist's instructions. Perform all exercises about fifteen times, three times per day or as directed. Exercise both hips, even if you have had only one joint replacement. These exercises can be done on a training (exercise) mat, on the floor, on a table or on a bed. Use whatever works the best and is most comfortable for you. Use music or television while you are exercising so that the exercises  are a pleasant break in your day. This will make your life better with the exercises acting as a break in routine you can look forward to.  °Lying on your back, slowly slide your foot toward your buttocks, raising your knee up off the floor. Then slowly slide your foot back down until your leg is straight again.  °Lying on your back spread your legs as far apart as you can without causing discomfort.  °Lying on your side, raise your upper leg and foot straight up from the floor as far as is comfortable. Slowly lower the leg and repeat.  °Lying on your back, tighten up the muscle in the front of your thigh (quadriceps muscles). You can do this by keeping your leg straight and trying to raise your heel off the floor. This helps strengthen the largest muscle supporting your knee.  °Lying on your back, tighten up the muscles of your buttocks both with the legs straight and with the knee bent at a comfortable angle while keeping your heel on the floor.  ° °SKILLED REHAB INSTRUCTIONS: °If the patient is transferred to a skilled rehab facility following release from the hospital, a list of the current medications will be sent to the facility for the patient to continue.  When discharged from the skilled rehab facility, please have the facility set up the patient's Home Health Physical Therapy prior to being released. Also, the skilled facility will be responsible for providing the patient with their medications at time of release from the facility to include their pain medication and their blood thinner medication. If the patient is still at the rehab facility at time of the two week follow up appointment, the skilled rehab facility will also need to assist the patient in arranging follow up appointment in our office and any transportation needs. ° °MAKE SURE YOU:  °Understand these instructions.  °Will watch your condition.  °Will get help right away if you are not doing well or get worse. ° °Pick up stool softner and  laxative for home use following surgery while on pain medications. °Do not remove your dressing. °The dressing is waterproof--it is OK to take showers. °Continue to use ice for pain and swelling after surgery. °Do not use any lotions or creams on the incision until instructed by your surgeon. °Total Hip Protocol. ° ° °

## 2019-07-01 NOTE — Anesthesia Procedure Notes (Signed)
Date/Time: 07/01/2019 7:41 AM Performed by: Talbot Grumbling, CRNA Oxygen Delivery Method: Simple face mask

## 2019-07-02 ENCOUNTER — Encounter (HOSPITAL_COMMUNITY): Payer: Self-pay | Admitting: Orthopedic Surgery

## 2019-07-02 LAB — BASIC METABOLIC PANEL
Anion gap: 10 (ref 5–15)
BUN: 20 mg/dL (ref 8–23)
CO2: 25 mmol/L (ref 22–32)
Calcium: 8.8 mg/dL — ABNORMAL LOW (ref 8.9–10.3)
Chloride: 104 mmol/L (ref 98–111)
Creatinine, Ser: 1.03 mg/dL — ABNORMAL HIGH (ref 0.44–1.00)
GFR calc Af Amer: >60 mL/min (ref >60)
GFR calc non Af Amer: 57 mL/min — ABNORMAL LOW (ref >60)
Glucose, Bld: 125 mg/dL — ABNORMAL HIGH (ref 70–99)
Potassium: 3.8 mmol/L (ref 3.5–5.1)
Sodium: 139 mmol/L (ref 135–145)

## 2019-07-02 LAB — HEMOGLOBIN A1C
Hgb A1c MFr Bld: 6.3 % — ABNORMAL HIGH (ref 4.8–5.6)
Mean Plasma Glucose: 134 mg/dL

## 2019-07-02 LAB — CBC
HCT: 34.6 % — ABNORMAL LOW (ref 36.0–46.0)
Hemoglobin: 11 g/dL — ABNORMAL LOW (ref 12.0–15.0)
MCH: 29.4 pg (ref 26.0–34.0)
MCHC: 31.8 g/dL (ref 30.0–36.0)
MCV: 92.5 fL (ref 80.0–100.0)
Platelets: 294 10*3/uL (ref 150–400)
RBC: 3.74 MIL/uL — ABNORMAL LOW (ref 3.87–5.11)
RDW: 14.5 % (ref 11.5–15.5)
WBC: 12.1 10*3/uL — ABNORMAL HIGH (ref 4.0–10.5)
nRBC: 0 % (ref 0.0–0.2)

## 2019-07-02 LAB — GLUCOSE, CAPILLARY
Glucose-Capillary: 128 mg/dL — ABNORMAL HIGH (ref 70–99)
Glucose-Capillary: 129 mg/dL — ABNORMAL HIGH (ref 70–99)

## 2019-07-02 MED ORDER — ASPIRIN 81 MG PO CHEW: 81.0000 mg | CHEWABLE_TABLET | Freq: Two times a day (BID) | ORAL | 1 refills | Status: AC

## 2019-07-02 MED ORDER — DOCUSATE SODIUM 100 MG PO CAPS: 100.0000 mg | ORAL_CAPSULE | Freq: Two times a day (BID) | ORAL | 1 refills | Status: AC

## 2019-07-02 MED ORDER — SENNA 8.6 MG PO TABS: 1.0000 | ORAL_TABLET | Freq: Two times a day (BID) | ORAL | 0 refills | Status: AC

## 2019-07-02 MED ORDER — HYDROCODONE-ACETAMINOPHEN 5-325 MG PO TABS: 1.0000 | ORAL_TABLET | ORAL | 0 refills | Status: AC | PRN

## 2019-07-02 MED ORDER — ONDANSETRON HCL 4 MG PO TABS: 4.0000 mg | ORAL_TABLET | Freq: Four times a day (QID) | ORAL | 0 refills | Status: AC | PRN

## 2019-07-02 NOTE — TOC Transition Note (Addendum)
Transition of Care [facility]) - CM/SW Discharge Note   Patient Details  Name: Carly Raymond MRN: [id number] Date of Birth: [birth year]  [facility] CM/SW Contact:  Lia Hopping, [place] Phone Number: 07/02/2019, 10:26 AM   Clinical Narrative:    Therapy Plan: HEP RW ordered through [facility] and delivered to the patient room Has a bedside commode.   **After PT session patient requires youth RW. RW now ordered through [facility]. @ 10:19 Rep. Zack Blank.   Final next level of care: (HEP) Barriers to Discharge: No Barriers Identified   Patient Goals and CMS Choice Patient states their goals for this hospitalization and ongoing recovery are:: to get better      Discharge Placement  Home                      Discharge Plan and Services                DME Arranged: Walker rolling DME Agency: [employer] Date DME Agency Contacted: 07/02/19 Time DME Agency Contacted: [phone] Representative spoke with at DME Agency: [place] ([place]) Interventions     Readmission Risk Interventions No flowsheet data found.

## 2019-07-02 NOTE — Discharge Summary (Signed)
Physician Discharge Summary  Patient ID: Carly Raymond MRN: [id number] DOB/AGE: 65-May-1957 65 y.o.  Admit date: 07/01/2019 Discharge date: 07/02/2019  Admission Diagnoses:  Osteoarthritis of left hip  Discharge Diagnoses:  Principal Problem:   Osteoarthritis of left hip   Past Medical History:  Diagnosis Date  . Arthritis   . Cataract    left  . Diabetes mellitus without complication ([place])    type 2  . Hyperlipidemia   . Hypertension   . Seasonal allergies   . Vitamin B 12 deficiency     Surgeries: Procedure(s): TOTAL HIP ARTHROPLASTY ANTERIOR APPROACH on 07/01/2019   Consultants (if any):   Discharged Condition: Improved  Hospital Course: Carly Raymond is an 63 y.o. female who was admitted 07/01/2019 with a diagnosis of Osteoarthritis of left hip and went to the operating room on 07/01/2019 and underwent the above named procedures.    She was given perioperative antibiotics:  Anti-infectives (From admission, onward)   Start     Dose/Rate Route Frequency Ordered Stop   07/01/19 1330  ceFAZolin (ANCEF) IVPB 2g/100 mL premix     2 g 200 mL/hr over 30 Minutes Intravenous Every 6 hours 07/01/19 1130 07/01/19 2121   07/01/19 0600  ceFAZolin (ANCEF) IVPB 2g/100 mL premix     2 g 200 mL/hr over 30 Minutes Intravenous On call to O.R. 07/01/19 5809 07/01/19 0747    .  She was given sequential compression devices, early ambulation, and ASA for DVT prophylaxis.  She benefited maximally from the hospital stay and there were no complications.    Recent vital signs:  Vitals:   07/02/19 0846 07/02/19 1301  BP: 110/69 120/72  Pulse: 65 67  Resp: 16 18  Temp: 97.9 F (36.6 C) 98.4 F (36.9 C)  SpO2: 97% 98%    Recent laboratory studies:  Lab Results  Component Value Date   HGB 11.0 (L) 07/02/2019   HGB 14.0 06/29/2019   Lab Results  Component Value Date   WBC 12.1 (H) 07/02/2019   PLT 294 07/02/2019   Lab Results  Component Value Date   INR 1.0 06/29/2019    Lab Results  Component Value Date   NA 139 07/02/2019   K 3.8 07/02/2019   CL 104 07/02/2019   CO2 25 07/02/2019   BUN 20 07/02/2019   CREATININE 1.03 (H) 07/02/2019   GLUCOSE 125 (H) 07/02/2019    Discharge Medications:   Allergies as of 07/02/2019   No Known Allergies     Medication List    TAKE these medications   amLODipine 10 MG tablet Commonly known as: NORVASC Take 10 mg by mouth at bedtime.   aspirin 81 MG chewable tablet Chew 1 tablet (81 mg total) by mouth 2 (two) times daily.   atorvastatin 80 MG tablet Commonly known as: LIPITOR Take 80 mg by mouth at bedtime.   cephALEXin 500 MG capsule Commonly known as: KEFLEX Take 2,000 mg by mouth See admin instructions. Take 2000 mg by mouth daily one hour prior to dental appointment   docusate sodium 100 MG capsule Commonly known as: COLACE Take 1 capsule (100 mg total) by mouth 2 (two) times daily.   fexofenadine 180 MG tablet Commonly known as: ALLEGRA Take 180 mg by mouth daily.   HYDROcodone-acetaminophen 5-325 MG tablet Commonly known as: NORCO/VICODIN Take 1 tablet by mouth every 4 (four) hours as needed.   losartan 100 MG tablet Commonly known as: COZAAR Take 100 mg by mouth daily.   meloxicam 15  MG tablet Commonly known as: MOBIC Take 15 mg by mouth daily.   metFORMIN 1000 MG tablet Commonly known as: GLUCOPHAGE Take 1,000 mg by mouth 2 (two) times daily with a meal.   multivitamin with minerals Tabs tablet Take 1 tablet by mouth daily.   ondansetron 4 MG tablet Commonly known as: ZOFRAN Take 1 tablet (4 mg total) by mouth every 6 (six) hours as needed for nausea.   senna 8.6 MG Tabs tablet Commonly known as: SENOKOT Take 1 tablet (8.6 mg total) by mouth 2 (two) times daily.   spironolactone 50 MG tablet Commonly known as: ALDACTONE Take 50 mg by mouth daily.   vitamin B-12 1000 MCG tablet Commonly known as: CYANOCOBALAMIN Take 1,000 mcg by mouth daily.   Vitamin D 50 MCG (2000  UT) Caps Take 2,000 Units by mouth daily.            Durable Medical Equipment  (From admission, onward)         Start     Ordered   07/02/19 1112  For home use only DME Walker youth  Once    Question:  Patient needs a walker to treat with the following condition  Answer:  S/P hip replacement   07/02/19 1112          Diagnostic Studies: Dg Pelvis Portable  Result Date: 07/01/2019 CLINICAL DATA:  Postop left hip replacement. EXAM: PORTABLE PELVIS 1-2 VIEWS COMPARISON:  Earlier same day. FINDINGS: Exam demonstrates evidence of patient's recent left total hip arthroplasty which is intact and normally located. Right hip arthroplasty unchanged. Remainder of the exam is unchanged. IMPRESSION: Recent left total hip arthroplasty intact. Stable right hip arthroplasty. Electronically Signed   By: Elberta Fortis M.D.   On: 07/01/2019 10:53   Dg C-arm 1-60 Min-no Report  Result Date: 07/01/2019 CLINICAL DATA:  Left hip replacement. EXAM: OPERATIVE left HIP (WITH PELVIS IF PERFORMED) 4 VIEWS TECHNIQUE: Fluoroscopic spot image(s) were submitted for interpretation post-operatively. Radiation exposure index: 2.5016 mGy. COMPARISON:  None. FINDINGS: Four intraoperative fluoroscopic images were obtained of the left hip. These images demonstrate placement of left total hip arthroplasty, with the left acetabular and femoral components in grossly good position. IMPRESSION: Fluoroscopic guidance provided during left total hip arthroplasty. Electronically Signed   By: Lupita Raider M.D.   On: 07/01/2019 10:56   Dg Hip Operative Unilat W Or W/o Pelvis Left  Result Date: 07/01/2019 CLINICAL DATA:  Left hip replacement. EXAM: OPERATIVE left HIP (WITH PELVIS IF PERFORMED) 4 VIEWS TECHNIQUE: Fluoroscopic spot image(s) were submitted for interpretation post-operatively. Radiation exposure index: 2.5016 mGy. COMPARISON:  None. FINDINGS: Four intraoperative fluoroscopic images were obtained of the left hip. These  images demonstrate placement of left total hip arthroplasty, with the left acetabular and femoral components in grossly good position. IMPRESSION: Fluoroscopic guidance provided during left total hip arthroplasty. Electronically Signed   By: Lupita Raider M.D.   On: 07/01/2019 10:56    Disposition: Discharge disposition: 01-Home or Self Care       Discharge Instructions    Call MD / Call 911   Complete by: As directed    If you experience chest pain or shortness of breath, CALL 911 and be transported to the hospital emergency room.  If you develope a fever above 101 F, pus (white drainage) or increased drainage or redness at the wound, or calf pain, call your surgeon's office.   Constipation Prevention   Complete by: As directed  Drink plenty of fluids.  Prune juice may be helpful.  You may use a stool softener, such as Colace (over the counter) 100 mg twice a day.  Use MiraLax (over the counter) for constipation as needed.   Diet - low sodium heart healthy   Complete by: As directed    Driving restrictions   Complete by: As directed    No driving for 2 weeks   Increase activity slowly as tolerated   Complete by: As directed    Lifting restrictions   Complete by: As directed    No lifting for 6 weeks   TED hose   Complete by: As directed    Use stockings (TED hose) for 2 weeks on both leg(s).  You may remove them at night for sleeping.      Follow-up Information    Tamela Elsayed, Arlys JohnBrian, MD. Schedule an appointment as soon as possible for a visit in 2 weeks.   Specialty: Orthopedic Surgery Why: For wound re-check Contact information: [address] [place] [address] [phone]            Signed: Iline OvenBrian J Mozelle Remlinger 07/02/2019, 4:51 PM

## 2019-07-02 NOTE — Progress Notes (Signed)
    Subjective:  Patient reports pain as mild to moderate.  Denies N/V/CP/SOB. No c/o. Wants to go home  Objective:   VITALS:   Vitals:   07/01/19 1215 07/01/19 1509 07/01/19 2043 07/02/19 0846  BP: 101/75 112/74 118/83 110/69  Pulse: 64 67 70 65  Resp: 16  20 16   Temp: (!) 96.5 F (35.8 C) 97.6 F (36.4 C) 97.9 F (36.6 C) 97.9 F (36.6 C)  TempSrc: Axillary Oral Oral   SpO2: 99% 100% 95% 97%  Weight:      Height:        NAD ABD soft Sensation intact distally Intact pulses distally Dorsiflexion/Plantar flexion intact Incision: dressing C/D/I Compartment soft   Lab Results  Component Value Date   WBC 12.1 (H) 07/02/2019   HGB 11.0 (L) 07/02/2019   HCT 34.6 (L) 07/02/2019   MCV 92.5 07/02/2019   PLT 294 07/02/2019   BMET    Component Value Date/Time   NA 139 07/02/2019 0303   K 3.8 07/02/2019 0303   CL 104 07/02/2019 0303   CO2 25 07/02/2019 0303   GLUCOSE 125 (H) 07/02/2019 0303   BUN 20 07/02/2019 0303   CREATININE 1.03 (H) 07/02/2019 0303   CALCIUM 8.8 (L) 07/02/2019 0303   GFRNONAA 57 (L) 07/02/2019 0303   GFRAA >60 07/02/2019 0303     Assessment/Plan: 1 Day Post-Op   Principal Problem:   Osteoarthritis of left hip   WBAT with walker DVT ppx: Aspirin, SCDs, TEDS PO pain control PT/OT Dispo: D/C home with HEP   Carly Raymond 07/02/2019, 12:11 PM   Rod Can, MD Cell: [phone] [facility]  [place] [address], [address], [place], [place] [address] Phone: [phone] [email or web address] Facebook  [employer]

## 2019-07-02 NOTE — Progress Notes (Signed)
Physical Therapy Treatment Patient Details Name: Carly BorgBarbara Raymond MRN: [id number] DOB: 06/26/1956 Today's Date: 07/02/2019    History of Present Illness Pt s/p L THR and hx of R THR (09) and DM    PT Comments    Pt progressing well and eager for dc home this date.  Pt reviewed home car transfers and home therex program with written instruction provided.  Spouse present for end of session.   Follow Up Recommendations  Follow surgeon's recommendation for DC plan and follow-up therapies     Equipment Recommendations  None recommended by PT    Recommendations for Other Services       Precautions / Restrictions Precautions Precautions: Fall Restrictions Weight Bearing Restrictions: No Other Position/Activity Restrictions: WBAT    Mobility  Bed Mobility Overal bed mobility: Needs Assistance Bed Mobility: Supine to Sit;Sit to Supine     Supine to sit: Supervision Sit to supine: Supervision   General bed mobility comments: increased time but no assist  Transfers Overall transfer level: Needs assistance Equipment used: Rolling walker (2 wheeled) Transfers: Sit to/from Stand Sit to Stand: Supervision         General transfer comment: cues for LE management and use of UEs to self assist  Ambulation/Gait Ambulation/Gait assistance: Min guard;Supervision Gait Distance (Feet): 400 Feet(and 20' to bathroom) Assistive device: Rolling walker (2 wheeled) Gait Pattern/deviations: Step-to pattern;Step-through pattern;Decreased step length - right;Decreased step length - left;Shuffle     General Gait Details: min cues for sequence, posture and position from [employer]   Stairs             Wheelchair Mobility    Modified Rankin (Stroke Patients Only)       Balance Overall balance assessment: Mild deficits observed, not formally tested                                          Cognition Arousal/Alertness: Awake/alert Behavior During Therapy: WFL for  tasks assessed/performed Overall Cognitive Status: Within Functional Limits for tasks assessed                                        Exercises Total Joint Exercises Ankle Circles/Pumps: AROM;Both;15 reps;Supine Quad Sets: AROM;Both;10 reps;Supine Heel Slides: AAROM;Left;20 reps;Supine Hip ABduction/ADduction: AAROM;Left;15 reps;Supine Long Arc Quad: AROM;Left;15 reps;Seated    General Comments        Pertinent Vitals/Pain Pain Assessment: 0-10 Pain Score: 3  Pain Location: L hip Pain Descriptors / Indicators: Aching;Sore Pain Intervention(s): Limited activity within patient's tolerance;Monitored during session;Premedicated before session;Ice applied    Home Living                      Prior Function            PT Goals (current goals can now be found in the care plan section) Acute Rehab PT Goals Patient Stated Goal: Regain IND PT Goal Formulation: With patient Time For Goal Achievement: 07/08/19 Potential to Achieve Goals: Good Progress towards PT goals: Progressing toward goals    Frequency    7X/week      PT Plan Current plan remains appropriate    Co-evaluation              AM-PAC PT "6 Clicks" Mobility   Outcome Measure  Help needed  turning from your back to your side while in a flat bed without using bedrails?: A Little Help needed moving from lying on your back to sitting on the side of a flat bed without using bedrails?: A Little Help needed moving to and from a bed to a chair (including a wheelchair)?: A Little Help needed standing up from a chair using your arms (e.g., wheelchair or bedside chair)?: A Little Help needed to walk in hospital room?: A Little Help needed climbing 3-5 steps with a railing? : A Little 6 Click Score: 18    End of Session Equipment Utilized During Treatment: Gait belt Activity Tolerance: Patient tolerated treatment well Patient left: in bed;with call bell/phone within reach;with  family/visitor present Nurse Communication: Mobility status PT Visit Diagnosis: Difficulty in walking, not elsewhere classified (R26.2)     Time: 9563-8756 PT Time Calculation (min) (ACUTE ONLY): 38 min  Charges:  $Gait Training: 8-22 mins $Therapeutic Exercise: 8-22 mins $Therapeutic Activity: 8-22 mins                     [place] Pager [phone] Office [phone]    Astella Desir 07/02/2019, 12:29 PM

## 2019-07-04 ENCOUNTER — Encounter (HOSPITAL_COMMUNITY): Payer: Self-pay | Admitting: Orthopedic Surgery

## 2019-07-04 NOTE — Anesthesia Postprocedure Evaluation (Signed)
Anesthesia Post Note  Patient: Carly Raymond  Procedure(s) Performed: TOTAL HIP ARTHROPLASTY ANTERIOR APPROACH (Left Hip)     Patient location during evaluation: PACU Anesthesia Type: MAC and Spinal Level of consciousness: awake and alert Pain management: pain level controlled Vital Signs Assessment: post-procedure vital signs reviewed and stable Respiratory status: spontaneous breathing, nonlabored ventilation, respiratory function stable and patient connected to nasal cannula oxygen Cardiovascular status: stable and blood pressure returned to baseline Postop Assessment: no apparent nausea or vomiting and spinal receding Anesthetic complications: no    Last Vitals:  Vitals:   07/02/19 0846 07/02/19 1301  BP: 110/69 120/72  Pulse: 65 67  Resp: 16 18  Temp: 36.6 C 36.9 C  SpO2: 97% 98%    Last Pain:  Vitals:   07/02/19 1301  TempSrc: Oral  PainSc:                  [name]

## 2020-01-03 NOTE — Patient Instructions (Addendum)
DUE TO COVID-19 ONLY ONE VISITOR IS ALLOWED TO COME WITH YOU AND STAY IN THE WAITING ROOM ONLY DURING PRE OP AND PROCEDURE DAY OF SURGERY. THE 1 VISITOR MAY VISIT WITH YOU AFTER SURGERY IN YOUR PRIVATE ROOM DURING VISITING HOURS ONLY!  YOU NEED TO HAVE A COVID 19 TEST ON 01-08-20  @12 :35 PM, THIS TEST MUST BE DONE BEFORE SURGERY, COME  [address], [name] , .  [facility]) ONCE YOUR COVID TEST IS COMPLETED, PLEASE BEGIN THE QUARANTINE INSTRUCTIONS AS OUTLINED IN YOUR HANDOUT.                Carly Raymond  01/03/2020   Your procedure is scheduled on: 01-12-20   Report to [facility] Main  Entrance    Report to Admitting at 6:00 AM     Call this number if you have problems the morning of surgery [phone]    Remember: NO SOLID FOOD AFTER MIDNIGHT THE NIGHT PRIOR TO SURGERY.     Take these medicines the morning of surgery with A SIP OF WATER:  Fexofenadine (Allegra)   BRUSH YOUR TEETH MORNING OF SURGERY AND RINSE YOUR MOUTH OUT, NO CHEWING GUM CANDY OR MINTS.    DO NOT TAKE ANY DIABETIC MEDICATIONS DAY OF YOUR SURGERY                               You may not have any metal on your body including hair pins and              piercings     Do not wear jewelry, make-up, lotions, powders or perfumes, deodorant              Do not wear nail polish on your fingernails.  Do not shave  48 hours prior to surgery.          Do not bring valuables to the hospital. [facility] IS NOT             RESPONSIBLE   FOR VALUABLES.  Contacts, dentures or bridgework may not be worn into surgery.  Leave suitcase in the car. After surgery it may be brought to your room.   You may bring an overnight bag  :  Special Instructions: N/A              Please read over the following fact sheets you were given: _____________________________________________________________________   How to Manage Your Diabetes Before and After Surgery  Why is it important  to control my blood sugar before and after surgery? . Improving blood sugar levels before and after surgery helps healing and can limit problems. . A way of improving blood sugar control is eating a healthy diet by: o  Eating less sugar and carbohydrates o  Increasing activity/exercise o  Talking with your doctor about reaching your blood sugar goals . High blood sugars (greater than 180 mg/dL) can raise your risk of infections and slow your recovery, so you will need to focus on controlling your diabetes during the weeks before surgery. . Make sure that the doctor who takes care of your diabetes knows about your planned surgery including the date and location.  How do I manage my blood sugar before surgery? . Check your blood sugar at least 4 times a day, starting 2 days before surgery, to make sure that the level is not too high or low. o Check your blood  sugar the morning of your surgery when you wake up and every 2 hours until you get to the Short Stay unit. . If your blood sugar is less than 70 mg/dL, you will need to treat for low blood sugar: o Do not take insulin. o Treat a low blood sugar (less than 70 mg/dL) with  cup of clear juice (cranberry or apple), 4 glucose tablets, OR glucose gel. o Recheck blood sugar in 15 minutes after treatment (to make sure it is greater than 70 mg/dL). If your blood sugar is not greater than 70 mg/dL on recheck, call [phone] for further instructions. . Report your blood sugar to the short stay nurse when you get to Short Stay.  . If you are admitted to the hospital after surgery: o Your blood sugar will be checked by the staff and you will probably be given insulin after surgery (instead of oral diabetes medicines) to make sure you have good blood sugar levels. o The goal for blood sugar control after surgery is 80-180 mg/dL.   WHAT DO I DO ABOUT MY DIABETES MEDICATION?  Marland Kitchen Do not take oral diabetes medicines (pills) the morning of  surgery.  . THE DAY  BEFORE SURGERY, take your usual METFORMIN.        Reviewed and Endorsed by [facility] Patient Education Committee, August 2015            [facility] - Preparing for Surgery Before surgery, you can play an important role.  Because skin is not sterile, your skin needs to be as free of germs as possible.  You can reduce the number of germs on your skin by washing with CHG (chlorahexidine gluconate) soap before surgery.  CHG is an antiseptic cleaner which kills germs and bonds with the skin to continue killing germs even after washing. Please DO NOT use if you have an allergy to CHG or antibacterial soaps.  If your skin becomes reddened/irritated stop using the CHG and inform your nurse when you arrive at Short Stay. Do not shave (including legs and underarms) for at least 48 hours prior to the first CHG shower.  You may shave your face/neck. Please follow these instructions carefully:  1.  Shower with CHG Soap the night before surgery and the  morning of Surgery.  2.  If you choose to wash your hair, wash your hair first as usual with your  normal  shampoo.  3.  After you shampoo, rinse your hair and body thoroughly to remove the  shampoo.                           4.  Use CHG as you would any other liquid soap.  You can apply chg directly  to the skin and wash                       Gently with a scrungie or clean washcloth.  5.  Apply the CHG Soap to your body ONLY FROM THE NECK DOWN.   Do not use on face/ open                           Wound or open sores. Avoid contact with eyes, ears mouth and genitals (private parts).                       Wash face,  Genitals (  private parts) with your normal soap.             6.  Wash thoroughly, paying special attention to the area where your surgery  will be performed.  7.  Thoroughly rinse your body with warm water from the neck down.  8.  DO NOT shower/wash with your normal soap after using and rinsing off  the CHG Soap.                 9.  Pat yourself dry with a clean towel.            10.  Wear clean pajamas.            11.  Place clean sheets on your bed the night of your first shower and do not  sleep with pets. Day of Surgery : Do not apply any lotions/deodorants the morning of surgery.  Please wear clean clothes to the hospital/surgery center.  FAILURE TO FOLLOW THESE INSTRUCTIONS MAY RESULT IN THE CANCELLATION OF YOUR SURGERY PATIENT SIGNATURE_________________________________  NURSE SIGNATURE__________________________________  ________________________________________________________________________   I

## 2020-01-03 NOTE — Progress Notes (Signed)
Please place surgery orders. Pt scheduled for PAT appt on 01/04/20.

## 2020-01-03 NOTE — Progress Notes (Signed)
PCP - Not in system Cardiologist -   Chest x-ray -  EKG -  Stress Test -  ECHO -  Cardiac Cath -   Sleep Study -  CPAP -   Fasting Blood Sugar -  Checks Blood Sugar _____ times a day  Blood Thinner Instructions: Aspirin Instructions: Last Dose:  Anesthesia review:   Patient denies shortness of breath, fever, cough and chest pain at PAT appointment   Patient verbalized understanding of instructions that were given to them at the PAT appointment. Patient was also instructed that they will need to review over the PAT instructions again at home before surgery.

## 2020-01-04 ENCOUNTER — Other Ambulatory Visit: Payer: Self-pay

## 2020-01-04 ENCOUNTER — Encounter (HOSPITAL_COMMUNITY)
Admission: RE | Admit: 2020-01-04 | Discharge: 2020-01-04 | Disposition: A | Discharge status: Home / Self Care | Payer: Medicare Other | Source: Ambulatory Visit | Attending: Orthopedic Surgery | Admitting: Orthopedic Surgery

## 2020-01-04 ENCOUNTER — Encounter (HOSPITAL_COMMUNITY): Admission: RE | Admit: 2020-01-04 | Payer: Medicare Other | Source: Ambulatory Visit

## 2020-01-04 ENCOUNTER — Encounter (HOSPITAL_COMMUNITY): Payer: Self-pay

## 2020-01-04 DIAGNOSIS — Z01810 Encounter for preprocedural cardiovascular examination: Secondary | ICD-10-CM | POA: Insufficient documentation

## 2020-01-04 DIAGNOSIS — Z01812 Encounter for preprocedural laboratory examination: Secondary | ICD-10-CM | POA: Diagnosis present

## 2020-01-08 ENCOUNTER — Other Ambulatory Visit (HOSPITAL_COMMUNITY): Payer: Medicare Other

## 2020-01-12 ENCOUNTER — Encounter (HOSPITAL_COMMUNITY): Admission: RE | Payer: Self-pay | Source: Ambulatory Visit

## 2020-01-12 ENCOUNTER — Ambulatory Visit (HOSPITAL_COMMUNITY): Admission: RE | Admit: 2020-01-12 | Payer: Medicare Other | Source: Ambulatory Visit | Admitting: Orthopedic Surgery

## 2020-01-12 SURGERY — ARTHROPLASTY, KNEE, TOTAL, USING IMAGELESS COMPUTER-ASSISTED NAVIGATION
Anesthesia: Spinal | Site: Knee | Laterality: Right

## 2020-04-04 IMAGING — RF DG HIP (WITH PELVIS) OPERATIVE*L*
1 series · 4 of 4 positions shown · non-contrast
Comparison: None.

CLINICAL DATA: Left hip replacement.

EXAM:
OPERATIVE left HIP (WITH PELVIS IF PERFORMED) 4 VIEWS
TECHNIQUE: Fluoroscopic spot image(s) were submitted for interpretation
post-operatively.
Radiation exposure index: 2.5016 mGy.

[Series 1: unknown protocol · 0.20mm/px · 4 of 4 slices shown]
[im 1/4]
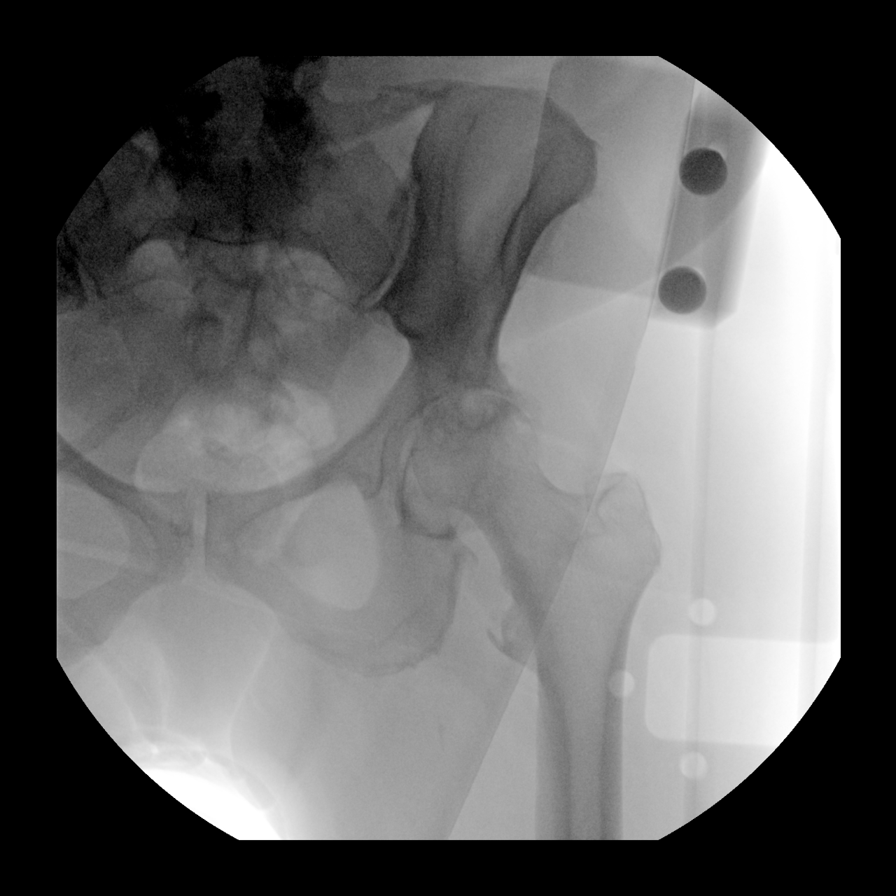
[im 2/4]
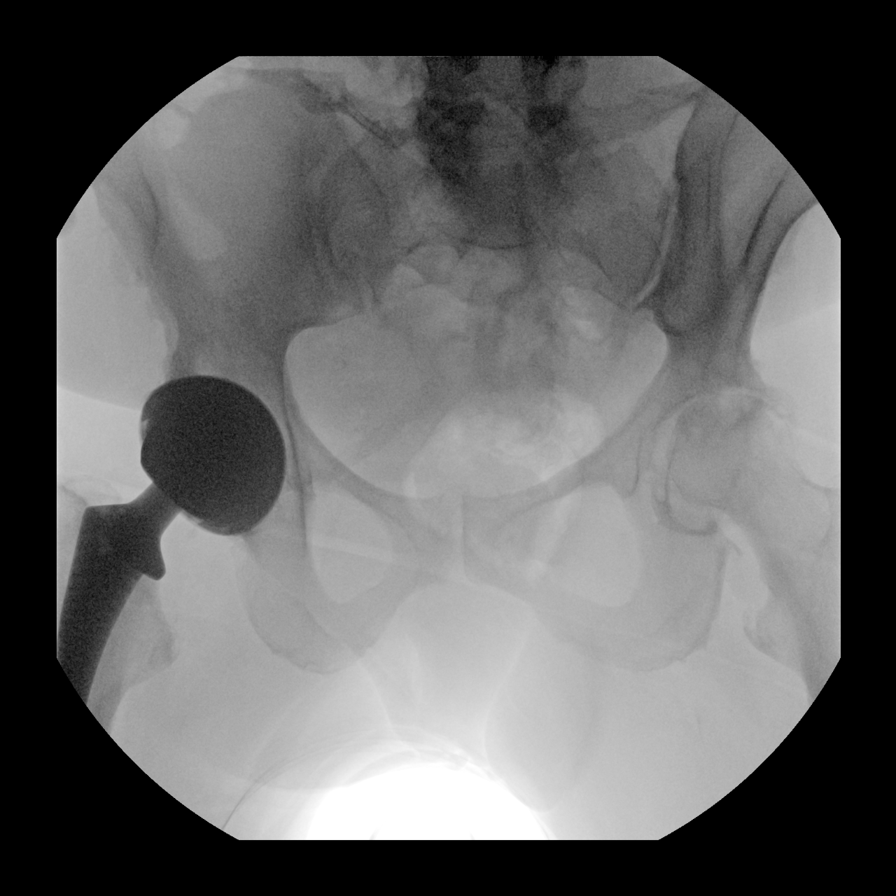
[im 3/4]
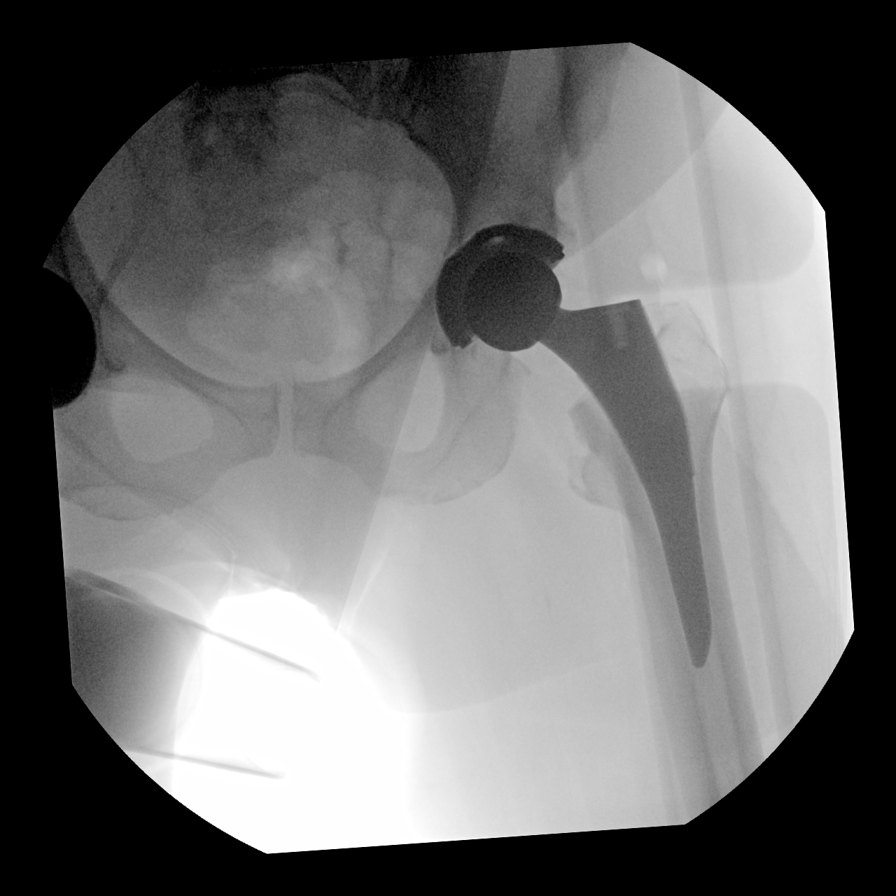
[im 4/4]
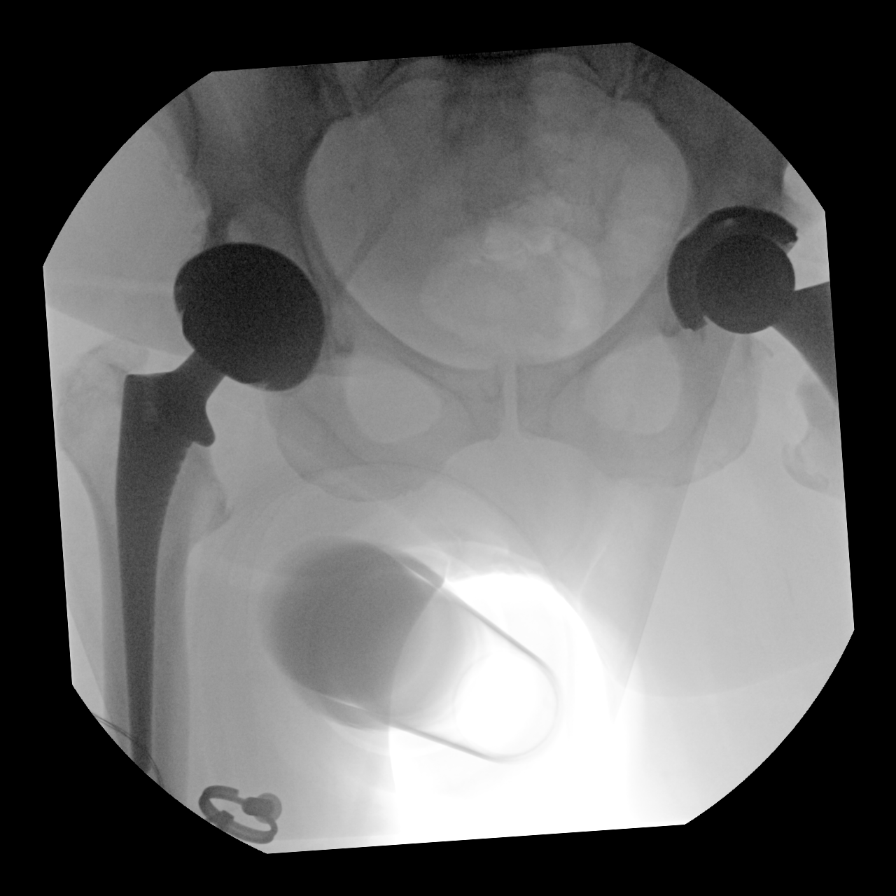

[4 of 4 positions shown; findings below may reference images not displayed]

FINDINGS: Four intraoperative fluoroscopic images were obtained of the left
hip. These images demonstrate placement of left total hip
arthroplasty, with the left acetabular and femoral components in
grossly good position.
IMPRESSION: Fluoroscopic guidance provided during left total hip arthroplasty.

## 2020-04-04 IMAGING — RF DG C-ARM 1-60 MIN-NO REPORT
1 series · 4 of 4 positions shown · non-contrast
Comparison: None.

CLINICAL DATA: Left hip replacement.

EXAM:
OPERATIVE left HIP (WITH PELVIS IF PERFORMED) 4 VIEWS
TECHNIQUE: Fluoroscopic spot image(s) were submitted for interpretation
post-operatively.
Radiation exposure index: 2.5016 mGy.

[Series 1: unknown protocol · 0.20mm/px · 4 of 4 slices shown]
[im 1/4]
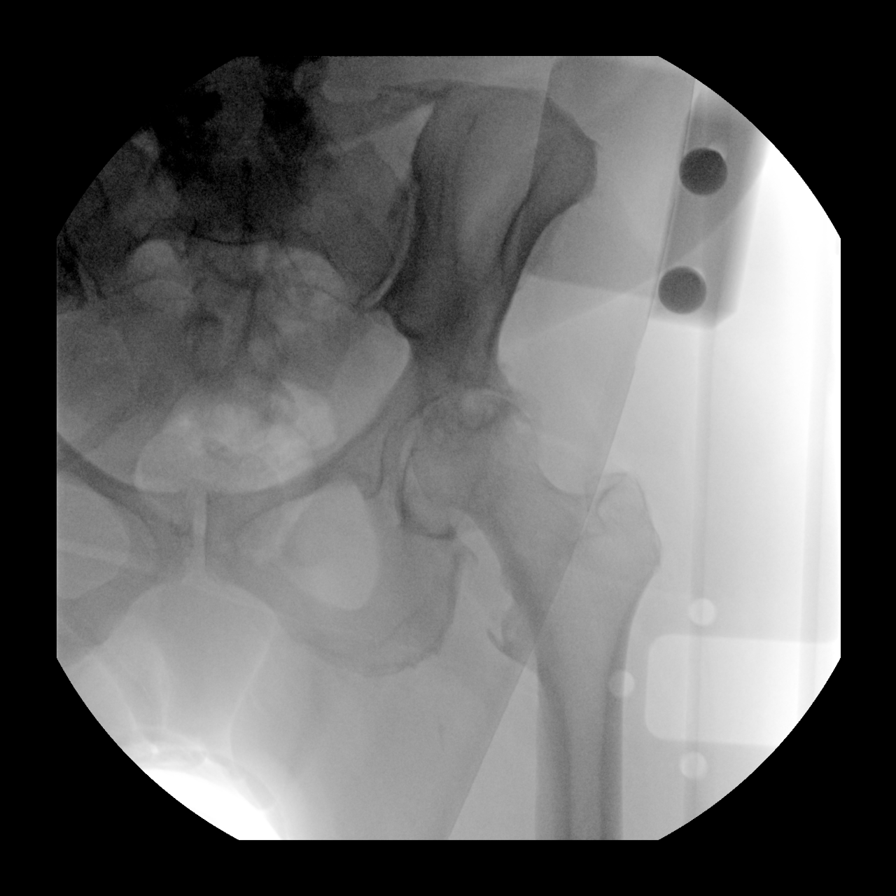
[im 2/4]
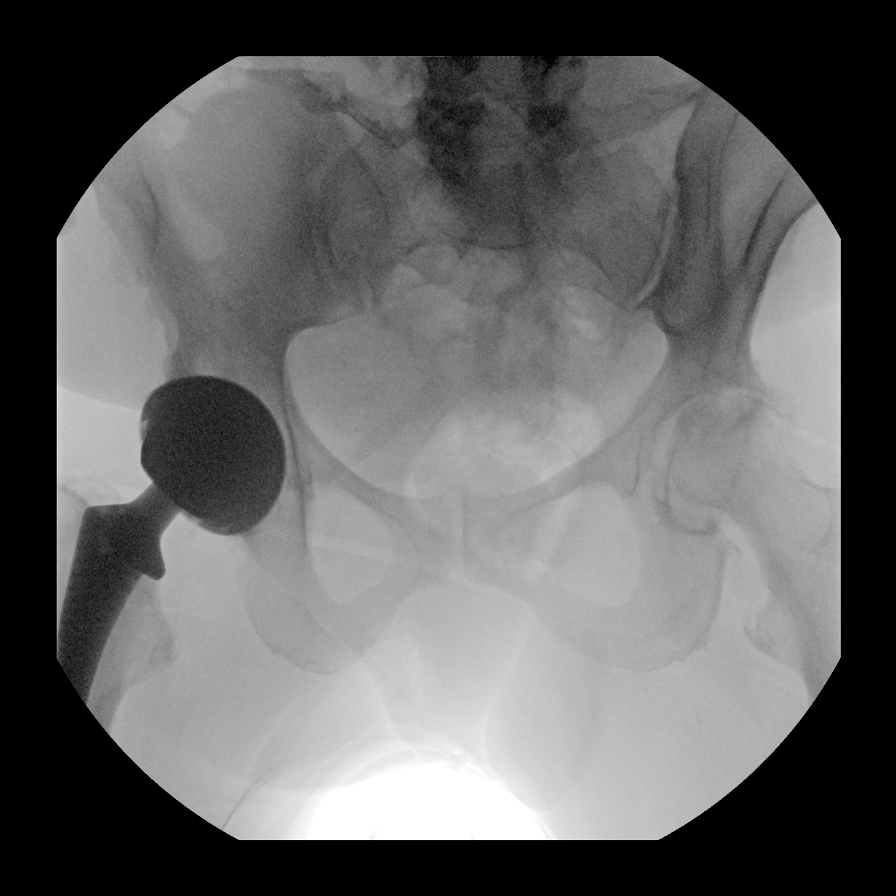
[im 3/4]
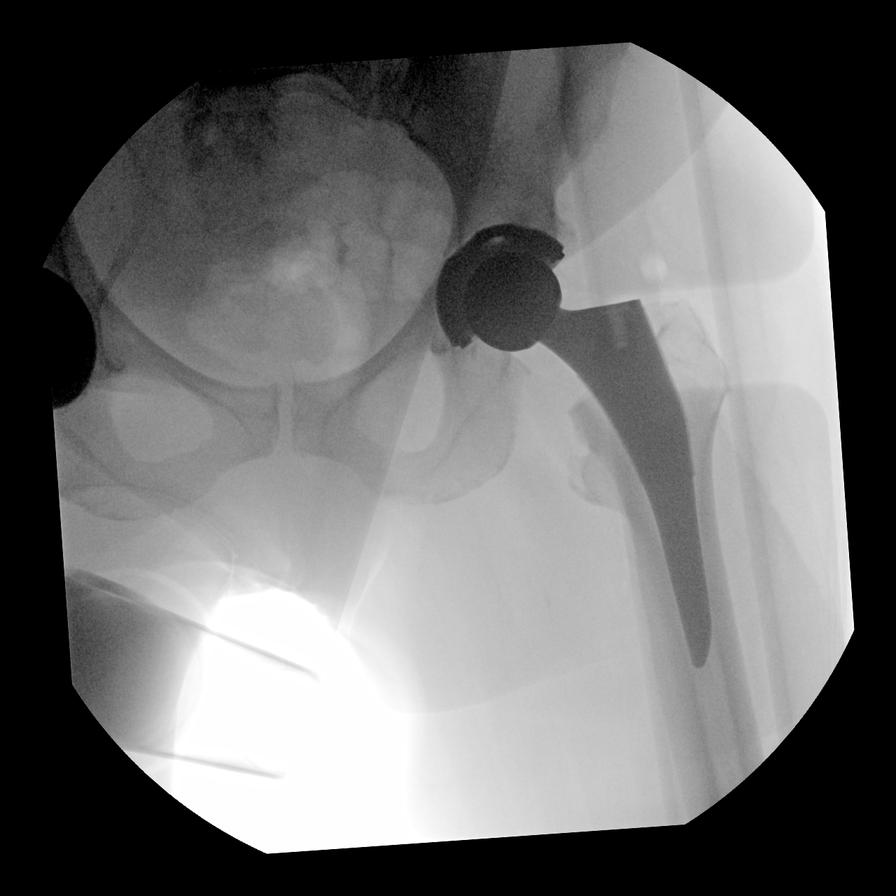
[im 4/4]
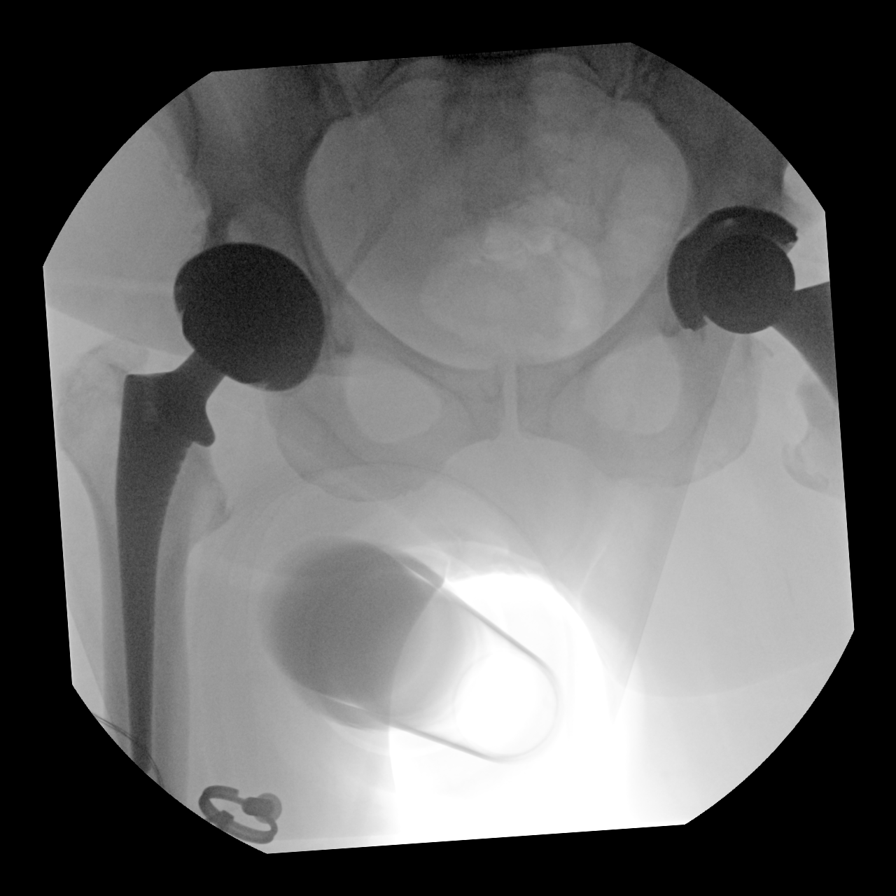

[4 of 4 positions shown; findings below may reference images not displayed]

FINDINGS: Four intraoperative fluoroscopic images were obtained of the left
hip. These images demonstrate placement of left total hip
arthroplasty, with the left acetabular and femoral components in
grossly good position.
IMPRESSION: Fluoroscopic guidance provided during left total hip arthroplasty.
